# Patient Record
Sex: Female | Born: 1975 | Race: Black or African American | Hispanic: No | Smoking: Never smoker
Health system: Southern US, Community
[De-identification: ages and names within clinical notes are randomized; demographics above are authoritative.]

## PROBLEM LIST (undated history)

## (undated) DIAGNOSIS — Z789 Other specified health status: Secondary | ICD-10-CM

## (undated) HISTORY — PX: BREAST LUMPECTOMY: SHX2

---

## 1998-06-05 ENCOUNTER — Inpatient Hospital Stay (HOSPITAL_COMMUNITY): Admission: AD | Admit: 1998-06-05 | Discharge: 1998-06-08 | Payer: Self-pay | Admitting: Obstetrics

## 1998-08-08 ENCOUNTER — Inpatient Hospital Stay (HOSPITAL_COMMUNITY): Admission: AD | Admit: 1998-08-08 | Discharge: 1998-08-08 | Payer: Self-pay | Admitting: Obstetrics & Gynecology

## 1998-08-14 ENCOUNTER — Inpatient Hospital Stay (HOSPITAL_COMMUNITY): Admission: AD | Admit: 1998-08-14 | Discharge: 1998-08-14 | Payer: Self-pay | Admitting: Obstetrics

## 1998-09-09 ENCOUNTER — Inpatient Hospital Stay (HOSPITAL_COMMUNITY): Admission: AD | Admit: 1998-09-09 | Discharge: 1998-09-09 | Payer: Self-pay | Admitting: Obstetrics

## 1998-10-18 ENCOUNTER — Inpatient Hospital Stay (HOSPITAL_COMMUNITY): Admission: AD | Admit: 1998-10-18 | Discharge: 1998-10-24 | Payer: Self-pay | Admitting: Obstetrics

## 1998-10-28 ENCOUNTER — Inpatient Hospital Stay (HOSPITAL_COMMUNITY): Admission: AD | Admit: 1998-10-28 | Discharge: 1998-10-28 | Payer: Self-pay | Admitting: Obstetrics & Gynecology

## 1998-11-11 ENCOUNTER — Inpatient Hospital Stay (HOSPITAL_COMMUNITY): Admission: AD | Admit: 1998-11-11 | Discharge: 1998-11-11 | Payer: Self-pay | Admitting: *Deleted

## 1998-11-13 ENCOUNTER — Inpatient Hospital Stay (HOSPITAL_COMMUNITY): Admission: AD | Admit: 1998-11-13 | Discharge: 1998-11-15 | Payer: Self-pay | Admitting: *Deleted

## 2001-07-20 ENCOUNTER — Encounter: Payer: Self-pay | Admitting: Emergency Medicine

## 2001-07-20 ENCOUNTER — Emergency Department (HOSPITAL_COMMUNITY): Admission: EM | Admit: 2001-07-20 | Discharge: 2001-07-20 | Payer: Self-pay | Admitting: Emergency Medicine

## 2001-08-31 ENCOUNTER — Encounter: Payer: Self-pay | Admitting: Emergency Medicine

## 2001-08-31 ENCOUNTER — Emergency Department (HOSPITAL_COMMUNITY): Admission: EM | Admit: 2001-08-31 | Discharge: 2001-08-31 | Payer: Self-pay | Admitting: Emergency Medicine

## 2001-09-01 ENCOUNTER — Emergency Department (HOSPITAL_COMMUNITY): Admission: EM | Admit: 2001-09-01 | Discharge: 2001-09-01 | Payer: Self-pay | Admitting: Emergency Medicine

## 2001-09-01 ENCOUNTER — Encounter: Payer: Self-pay | Admitting: Emergency Medicine

## 2002-12-08 ENCOUNTER — Emergency Department (HOSPITAL_COMMUNITY): Admission: EM | Admit: 2002-12-08 | Discharge: 2002-12-08 | Payer: Self-pay | Admitting: Emergency Medicine

## 2003-05-01 ENCOUNTER — Emergency Department (HOSPITAL_COMMUNITY): Admission: EM | Admit: 2003-05-01 | Discharge: 2003-05-02 | Payer: Self-pay | Admitting: Emergency Medicine

## 2003-06-13 ENCOUNTER — Emergency Department (HOSPITAL_COMMUNITY): Admission: EM | Admit: 2003-06-13 | Discharge: 2003-06-13 | Payer: Self-pay

## 2004-07-21 ENCOUNTER — Inpatient Hospital Stay (HOSPITAL_COMMUNITY): Admission: AD | Admit: 2004-07-21 | Discharge: 2004-07-22 | Payer: Self-pay | Admitting: Family Medicine

## 2004-07-28 ENCOUNTER — Emergency Department (HOSPITAL_COMMUNITY): Admission: EM | Admit: 2004-07-28 | Discharge: 2004-07-28 | Payer: Self-pay | Admitting: Emergency Medicine

## 2005-01-22 ENCOUNTER — Emergency Department (HOSPITAL_COMMUNITY): Admission: EM | Admit: 2005-01-22 | Discharge: 2005-01-22 | Payer: Self-pay | Admitting: Emergency Medicine

## 2005-06-11 ENCOUNTER — Emergency Department (HOSPITAL_COMMUNITY): Admission: EM | Admit: 2005-06-11 | Discharge: 2005-06-11 | Payer: Self-pay | Admitting: Emergency Medicine

## 2005-08-25 ENCOUNTER — Ambulatory Visit: Payer: Self-pay | Admitting: Obstetrics and Gynecology

## 2005-08-25 ENCOUNTER — Encounter (INDEPENDENT_AMBULATORY_CARE_PROVIDER_SITE_OTHER): Payer: Self-pay | Admitting: *Deleted

## 2005-10-13 ENCOUNTER — Emergency Department (HOSPITAL_COMMUNITY): Admission: EM | Admit: 2005-10-13 | Discharge: 2005-10-13 | Payer: Self-pay | Admitting: Emergency Medicine

## 2006-03-01 ENCOUNTER — Emergency Department (HOSPITAL_COMMUNITY): Admission: EM | Admit: 2006-03-01 | Discharge: 2006-03-01 | Payer: Self-pay | Admitting: Emergency Medicine

## 2006-03-05 ENCOUNTER — Ambulatory Visit: Payer: Self-pay | Admitting: Gynecology

## 2006-03-05 ENCOUNTER — Encounter (INDEPENDENT_AMBULATORY_CARE_PROVIDER_SITE_OTHER): Payer: Self-pay | Admitting: *Deleted

## 2006-11-17 ENCOUNTER — Ambulatory Visit: Payer: Self-pay | Admitting: Obstetrics and Gynecology

## 2006-11-17 ENCOUNTER — Encounter (INDEPENDENT_AMBULATORY_CARE_PROVIDER_SITE_OTHER): Payer: Self-pay | Admitting: *Deleted

## 2007-01-06 ENCOUNTER — Ambulatory Visit: Payer: Self-pay | Admitting: Obstetrics & Gynecology

## 2007-01-06 ENCOUNTER — Other Ambulatory Visit: Admission: RE | Admit: 2007-01-06 | Discharge: 2007-01-06 | Payer: Self-pay | Admitting: Obstetrics & Gynecology

## 2007-01-15 ENCOUNTER — Ambulatory Visit: Payer: Self-pay | Admitting: *Deleted

## 2007-01-15 ENCOUNTER — Inpatient Hospital Stay (HOSPITAL_COMMUNITY): Admission: AD | Admit: 2007-01-15 | Discharge: 2007-01-15 | Payer: Self-pay | Admitting: Obstetrics and Gynecology

## 2007-01-20 ENCOUNTER — Ambulatory Visit: Payer: Self-pay | Admitting: Obstetrics & Gynecology

## 2007-05-26 ENCOUNTER — Encounter (INDEPENDENT_AMBULATORY_CARE_PROVIDER_SITE_OTHER): Payer: Self-pay | Admitting: Obstetrics & Gynecology

## 2007-05-26 ENCOUNTER — Ambulatory Visit: Payer: Self-pay | Admitting: Obstetrics & Gynecology

## 2007-06-01 ENCOUNTER — Emergency Department (HOSPITAL_COMMUNITY): Admission: EM | Admit: 2007-06-01 | Discharge: 2007-06-02 | Payer: Self-pay | Admitting: Emergency Medicine

## 2007-11-18 ENCOUNTER — Encounter (INDEPENDENT_AMBULATORY_CARE_PROVIDER_SITE_OTHER): Payer: Self-pay | Admitting: Gynecology

## 2007-11-18 ENCOUNTER — Ambulatory Visit: Payer: Self-pay | Admitting: Gynecology

## 2010-07-19 ENCOUNTER — Emergency Department (HOSPITAL_COMMUNITY): Admission: EM | Admit: 2010-07-19 | Discharge: 2010-07-19 | Payer: Self-pay | Admitting: Emergency Medicine

## 2011-04-21 NOTE — Group Therapy Note (Signed)
Patricia Ford, HEINE NO.:  [id number]   MEDICAL RECORD NO.:  [id number]          PATIENT TYPE:  WOC   LOCATION:  [facility]                   FACILITY:  [facility]   PHYSICIAN:  Dorthula Perfect, MD     DATE OF BIRTH:  1976/08/31   DATE OF SERVICE:                                  CLINIC NOTE   A 35 year old black female returns today for followup pap smear. She is  gravida 3 and para 3. She is not having menstrual periods. She [facility]  [facility] and is due to have  it repeated in August.   She had a LEEP procedure done here in January of this year. Pathology  revealed low grade squamous intraepithelial lesion involving the  endocervical margin in the 12 to 3 and 3 to 6 o'clock quadrant and the  ectocervical margin was not involved.   She has been complaining of a 3 day history of her left ear hurting. She  denies upper respiratory tract symptoms.   PHYSICAL EXAMINATION:  Height 5 feet 5 inches, weight 173, blood  pressure 112/75.  Examination with the otoscope reveals both ears to be completely normal.  Tympanic membranes are normal.  ABDOMEN: Flat, soft, and nontender.  PELVIC EXAM: External genitalia and bus glands are normal. Vaginal vault  was epithelized as was the cervix which showed scarring from her LEEP  procedure. On bimanual exam the uterus is of normal size and shape. It  is nontender. The adnexal strictures are normal.   IMPRESSION:  History of CIN 1 cervical disease with LEEP.   DISPOSITION:  1. Pap smear.  2. Return in 6 months for repeat pap smear.           ______________________________  Dorthula Perfect, MD     ER/MEDQ  D:  05/26/2007  T:  05/26/2007  Job:  [id number]

## 2011-04-24 NOTE — Group Therapy Note (Signed)
Patricia Ford, NO NO.:  [id number]   MEDICAL RECORD NO.:  [id number]          PATIENT TYPE:  WOC   LOCATION:  [facility]                   FACILITY:  [facility]   PHYSICIAN:  Argentina Donovan, MD        DATE OF BIRTH:  02/02/1976   DATE OF SERVICE:                                  CLINIC NOTE   The patient is a 35 year old gravida 3, para 3-0-0-3, who had been seen  in 2006 by the [facility] department for an abnormal Pap smear.  Colposcopy  was done at that time and showed CIN-1, and was considered  unsatisfactory because they could not see the entire lesion.  So, she  came here for consideration of a LEEP or other procedure.  On the  examination, the patient had a clean, parous cervix with a very a small  external os.  So, I did an endocervical Pap smear and also HPV typing.  The Pap smear at that time came back normal.  However, it was filed at  the back of the chart so that the one in front of it which was an older  smear seemed to be the most abnormal.  Therefore in followup when she  recently had a Pap smear which was mild dysplasia, because of the  previous notes in the health department, she was referred for possible  LEEP.  In reviewing the chart, I think we may have cleared that up.  As  far as we can tell, the most recent Pap smear showed LSIL with no sign  of high-risk HPV.  Therefore, instead of doing the LEEP however, not  wanting to this anything significant, we were going to repeat the Pap  smear, screen for HPV and in addition have the patient see the film on  LEEP in case the Pap smear does come back significantly abnormal.  We  will call her and have her come back in for a LEEP without having to go  through an extra visit.   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

## 2011-04-24 NOTE — Group Therapy Note (Signed)
NAMEJORETTA, EADS NO.:  [id number]   MEDICAL RECORD NO.:  [id number]          PATIENT TYPE:  WOC   LOCATION:  [facility]                   FACILITY:  [facility]   PHYSICIAN:  Argentina Donovan, MD        DATE OF BIRTH:  10/03/1976   DATE OF SERVICE:  08/25/2005                                    CLINIC NOTE   Patient is a 35 year old gravida 3, para 3-0-0-3 who was [facility] with an abnormal Pap smear.  Colposcopy was done and the biopsy  showed CIN I.  It was considered unsatisfactory because they could not see  the entire lesion so she was sent in here for consideration for LEEP or some  other procedure.  On examination the patient has a clean, parous cervix with  a rather small external os.  So what I have done is using a brush, did an  endocervical Pap smear and also an HPV typing.  If there is no significant  high-risk cells will probably repeat her Pap smear in six months.  If there  is significant pathology in the endocervix will do a LEEP biopsy.  She  seemed to be satisfied with this explanation.  She also has complained of  some external vulvar bumps forming but on examination they are just small  hair follicle abscesses that are scarred up and no sign of any condyloma  acuminata.   IMPRESSION:  Unsatisfactory colposcopy with CIN I.           ______________________________  Argentina Donovan, MD     PR/MEDQ  D:  08/25/2005  T:  08/26/2005  Job:  [id number]

## 2012-06-30 ENCOUNTER — Encounter: Payer: Self-pay | Admitting: Advanced Practice Midwife

## 2012-09-04 ENCOUNTER — Emergency Department (HOSPITAL_COMMUNITY): Payer: Medicaid Other

## 2012-09-04 ENCOUNTER — Encounter (HOSPITAL_COMMUNITY): Payer: Self-pay | Admitting: *Deleted

## 2012-09-04 ENCOUNTER — Emergency Department (HOSPITAL_COMMUNITY)
Admission: EM | Admit: 2012-09-04 | Discharge: 2012-09-04 | Disposition: A | Discharge status: Home / Self Care | Payer: Medicaid Other | Attending: Emergency Medicine | Admitting: Emergency Medicine

## 2012-09-04 DIAGNOSIS — R071 Chest pain on breathing: Secondary | ICD-10-CM | POA: Insufficient documentation

## 2012-09-04 DIAGNOSIS — R110 Nausea: Secondary | ICD-10-CM | POA: Insufficient documentation

## 2012-09-04 DIAGNOSIS — R0789 Other chest pain: Secondary | ICD-10-CM

## 2012-09-04 DIAGNOSIS — R1011 Right upper quadrant pain: Secondary | ICD-10-CM | POA: Insufficient documentation

## 2012-09-04 LAB — URINALYSIS, ROUTINE W REFLEX MICROSCOPIC
Ketones, ur: NEGATIVE mg/dL
Leukocytes, UA: NEGATIVE
Nitrite: NEGATIVE
Protein, ur: NEGATIVE mg/dL
Urobilinogen, UA: 0.2 mg/dL (ref 0.0–1.0)

## 2012-09-04 LAB — COMPREHENSIVE METABOLIC PANEL
ALT: 11 U/L (ref 0–35)
Albumin: 3.7 g/dL (ref 3.5–5.2)
Alkaline Phosphatase: 59 U/L (ref 39–117)
BUN: 10 mg/dL (ref 6–23)
CO2: 23 mEq/L (ref 19–32)
Calcium: 9.2 mg/dL (ref 8.4–10.5)
Chloride: 101 mEq/L (ref 96–112)
Creatinine, Ser: 0.6 mg/dL (ref 0.50–1.10)
GFR calc Af Amer: >90 mL/min (ref >90)
Glucose, Bld: 84 mg/dL (ref 70–99)
Potassium: 4 mEq/L (ref 3.5–5.1)
Total Protein: 7.7 g/dL (ref 6.0–8.3)

## 2012-09-04 MED ORDER — GI COCKTAIL ~~LOC~~
30.0000 mL | Freq: Once | ORAL | Status: AC
  Administered 2012-09-04: 30 mL via ORAL
  Filled 2012-09-04: qty 30

## 2012-09-04 MED ORDER — ONDANSETRON HCL 4 MG PO TABS: 4.0000 mg | ORAL_TABLET | Freq: Four times a day (QID) | ORAL | Status: DC

## 2012-09-04 MED ORDER — IBUPROFEN 400 MG PO TABS: 400.0000 mg | ORAL_TABLET | Freq: Four times a day (QID) | ORAL | Status: DC | PRN

## 2012-09-04 MED ORDER — ONDANSETRON HCL 4 MG/2ML IJ SOLN
4.0000 mg | Freq: Once | INTRAMUSCULAR | Status: AC
  Administered 2012-09-04: 4 mg via INTRAVENOUS
  Filled 2012-09-04: qty 2

## 2012-09-04 MED ORDER — HYDROMORPHONE HCL PF 1 MG/ML IJ SOLN
0.5000 mg | Freq: Once | INTRAMUSCULAR | Status: AC
  Administered 2012-09-04: 0.5 mg via INTRAVENOUS
  Filled 2012-09-04: qty 1

## 2012-09-04 MED ORDER — POLYETHYLENE GLYCOL 3350 17 GM/SCOOP PO POWD: 17.0000 g | Freq: Every day | ORAL | Status: DC

## 2012-09-04 MED ORDER — HYDROCODONE-ACETAMINOPHEN 5-325 MG PO TABS: 2.0000 | ORAL_TABLET | ORAL | Status: DC | PRN

## 2012-09-04 MED ORDER — DIPHENHYDRAMINE HCL 50 MG/ML IJ SOLN: 25.0000 mg | INTRAMUSCULAR | Status: DC | PRN

## 2012-09-04 NOTE — ED Notes (Signed)
Pt reports pain beneath right breast. Pt reports pain seems to move over to left. Pt also reports feeling like her heart is "fluttering."  Pt denies shortness of breath or nausea. Pt reports pain increases with movement. Pt also reports pain with movement. Pt reports having this pain prior and thought it was a muscle strain. Pt has used tylenol and pepto bismol at home without relief.

## 2012-09-04 NOTE — ED Provider Notes (Signed)
History     CSN: [id number]  Arrival date & time 09/04/12  1500   First MD Initiated Contact with Patient 09/04/12 1559      Chief Complaint  Patient presents with  . Chest Pain    (Consider location/radiation/quality/duration/timing/severity/associated sxs/prior treatment) HPI Patricia Ford is a 36 y.o. female or who presents to the ER with a 1-1/2 weeks worsening right upper quadrant pain is acutely worsened over the last 24 hours. It is described as a burning pain, a 10/10, it is worse after eating-she noticed it worsened acutely last night after eating spaghetti), she's had nausea without vomiting, no diarrhea, no subjective fevers or chills, no change in her stools, no central chest pain, pressure type chest pain or shortness of breath. She says she does notice a fluttering in her chest when the pain is acute, and the pain occasionally radiates to the right shoulder blade. She's taken Tylenol without much relief.  No history of GERD. Patient's sister recently had her gallbladder out. No other alleviating or exacerbating factors and no other associated symptoms.  History reviewed. No pertinent past medical history.  Past Surgical History  Procedure Date  . Breast lumpectomy     No family history on file.  History  Substance Use Topics  . Smoking status: Never Smoker   . Smokeless tobacco: Not on file  . Alcohol Use: No    OB History    Grav Para Term Preterm Abortions TAB SAB Ect Mult Living                  Review of Systems At least 10pt or greater review of systems completed and are negative except where specified in the HPI.  Allergies  Penicillins and Percocet  Home Medications   Current Outpatient Rx  Name Route Sig Dispense Refill  . ACETAMINOPHEN 500 MG PO TABS Oral Take 500 mg by mouth every 6 (six) hours as needed. pain      BP 114/74  Pulse 79  Temp 98.1 F (36.7 C) (Oral)  Resp 24  SpO2 98%  Physical Exam  Nursing notes reviewed.   Electronic medical record reviewed. VITAL SIGNS:   Filed Vitals:   09/04/12 1516  BP: 114/74  Pulse: 79  Temp: 98.1 F (36.7 C)  TempSrc: Oral  Resp: 24  SpO2: 98%   CONSTITUTIONAL: Awake, oriented, appears non-toxic HENT: Atraumatic, normocephalic, oral mucosa pink and moist, airway patent. Nares patent without drainage. External ears normal. EYES: Conjunctiva clear, EOMI, PERRLA NECK: Trachea midline, non-tender, supple CARDIOVASCULAR: Normal heart rate, Normal rhythm, No murmurs, rubs, gallops PULMONARY/CHEST: Clear to auscultation, no rhonchi, wheezes, or rales. Symmetrical breath sounds. Non-tender. ABDOMINAL: Non-distended, soft, mild TTP in RUQ.  BS normal. NEUROLOGIC: Non-focal, moving all four extremities, no gross sensory or motor deficits. EXTREMITIES: No clubbing, cyanosis, or edema SKIN: Warm, Dry, No erythema, No rash  ED Course  Procedures (including critical care time)  Date: 09/04/2012  Rate: 60  Rhythm: normal sinus rhythm  QRS Axis: normal  Intervals: normal  ST/T Wave abnormalities: normal  Conduction Disutrbances: none  Narrative Interpretation: unremarkable - sinus rhythm  Labs Reviewed  COMPREHENSIVE METABOLIC PANEL - Abnormal; Notable for the following:    Sodium 134 (*)     Total Bilirubin 0.2 (*)     All other components within normal limits  LIPASE, BLOOD  URINALYSIS, ROUTINE W REFLEX MICROSCOPIC  PREGNANCY, URINE   US Abdomen Complete  09/04/2012  *RADIOLOGY REPORT*  Clinical Data: Right upper  quadrant pain, worse after eating.  ABDOMEN ULTRASOUND  Technique:  Complete abdominal ultrasound examination was performed including evaluation of the liver, gallbladder, bile ducts, pancreas, kidneys, spleen, IVC, and abdominal aorta.  Comparison: No comparison studies available.  Findings:  Gallbladder:  There is no evidence for gallstones.  No gallbladder wall thickening or pericholecystic fluid.  The sonographer reports no sonographic Murphy's sign.   Common Bile Duct:  Nondilated at 4 mm diameter.  Liver:  Normal.  No focal parenchymal abnormality.  No biliary dilation.  IVC:  Normal.  Pancreas:  Normal.  Spleen:  Normal.  Right kidney:  10.9 cm in long axis.  Mild fullness of the renal pelvis.  Otherwise normal.  Left kidney:  10.6 cm in long axis.  Normal.  Abdominal Aorta:  No aneurysm.  IMPRESSION: Mild fullness of the right renal pelvis without overt hydronephrosis on either side.  Right-sided changes may be related to hydration status or developmental.  Otherwise normal exam.   Original Report Authenticated By: ERIC A. MANSELL, M.D.      No diagnosis found.    MDM  Patricia Ford is a 36 y.o. female right upper corner pain with some associated fluttering when the pain is bad but does radiate to the right shoulder blade this is concerning for gallbladder disease. I do not think that the pain she is experiencing is related to acute coronary syndrome, she is PERC negative - more than likely this is due to a problem with her GI tract also will consider dyspepsia, acid reflux, hepatitis, pancreatitis.  Patient is feeling better with medications, her laboratory workup is unremarkable. Ultrasound of the right upper quadrant is nonacute, no cholecystitis, cholelithiasis, ductal dilation. On evaluation the right kidney, there is mild fullness of the renal pelvis which radiologist remarks may be related to hydration status versus developmental. Urinalysis is benign, no blood cells seen in the urine, I have no suspicion for obstruction of the right ureter, and the patient is benign and in no apparent distress on physical exam.  Patient received no relief from the GI cocktail, in fact it made her slightly nauseous. Saltine crackers resolve this problem. Chest x-ray is unremarkable also.  Patient is in no acute distress, and is having no pain at this time, she'll be discharged to home in good condition with some pain medicine, nausea medicine,  ibuprofen for chest wall pain and some GlycoLax so that she does not become constipated.  I explained the diagnosis and have given explicit precautions to return to the ER including worsening abdominal pain, vomiting despite medications, high fevers or any other new or worsening symptoms. The patient understands and accepts the medical plan as it's been dictated and I have answered their questions. Discharge instructions concerning home care and prescriptions have been given.  The patient is STABLE and is discharged to home in good condition.       Jones Skene, MD 09/04/12 2100

## 2015-02-26 ENCOUNTER — Encounter (HOSPITAL_COMMUNITY): Payer: Self-pay

## 2015-02-26 ENCOUNTER — Emergency Department (HOSPITAL_COMMUNITY)
Admission: EM | Admit: 2015-02-26 | Discharge: 2015-02-26 | Disposition: A | Discharge status: Home / Self Care | Payer: Medicaid Other | Attending: Emergency Medicine | Admitting: Emergency Medicine

## 2015-02-26 DIAGNOSIS — Z79899 Other long term (current) drug therapy: Secondary | ICD-10-CM | POA: Insufficient documentation

## 2015-02-26 DIAGNOSIS — Z880 Allergy status to penicillin: Secondary | ICD-10-CM | POA: Diagnosis not present

## 2015-02-26 DIAGNOSIS — R51 Headache: Secondary | ICD-10-CM | POA: Diagnosis not present

## 2015-02-26 DIAGNOSIS — Z793 Long term (current) use of hormonal contraceptives: Secondary | ICD-10-CM | POA: Diagnosis not present

## 2015-02-26 DIAGNOSIS — R519 Headache, unspecified: Secondary | ICD-10-CM

## 2015-02-26 MED ORDER — ONDANSETRON 8 MG PO TBDP: 8.0000 mg | ORAL_TABLET | Freq: Three times a day (TID) | ORAL | Status: DC | PRN

## 2015-02-26 MED ORDER — MORPHINE SULFATE 4 MG/ML IJ SOLN
4.0000 mg | Freq: Once | INTRAMUSCULAR | Status: AC
  Administered 2015-02-26: 4 mg via INTRAVENOUS
  Filled 2015-02-26: qty 1

## 2015-02-26 MED ORDER — NAPROXEN 500 MG PO TABS: 500.0000 mg | ORAL_TABLET | Freq: Two times a day (BID) | ORAL | Status: DC

## 2015-02-26 MED ORDER — ONDANSETRON HCL 4 MG/2ML IJ SOLN
4.0000 mg | Freq: Once | INTRAMUSCULAR | Status: AC
  Administered 2015-02-26: 4 mg via INTRAVENOUS
  Filled 2015-02-26: qty 2

## 2015-02-26 MED ORDER — SODIUM CHLORIDE 0.9 % IV BOLUS (SEPSIS)
1000.0000 mL | Freq: Once | INTRAVENOUS | Status: AC
  Administered 2015-02-26: 1000 mL via INTRAVENOUS

## 2015-02-26 MED ORDER — KETOROLAC TROMETHAMINE 30 MG/ML IJ SOLN
30.0000 mg | Freq: Once | INTRAMUSCULAR | Status: AC
  Administered 2015-02-26: 30 mg via INTRAVENOUS
  Filled 2015-02-26: qty 1

## 2015-02-26 MED ORDER — PROCHLORPERAZINE EDISYLATE 5 MG/ML IJ SOLN
10.0000 mg | Freq: Four times a day (QID) | INTRAMUSCULAR | Status: DC | PRN
  Administered 2015-02-26: 10 mg via INTRAVENOUS
  Filled 2015-02-26: qty 2

## 2015-02-26 NOTE — Discharge Instructions (Signed)

## 2015-02-26 NOTE — ED Notes (Signed)
Pt comfortable with discharge and follow up instructions. Prescriptions x2. 

## 2015-02-26 NOTE — ED Notes (Signed)
Pt. Developed a headache on Sunday.  Describes the pain generalized throbbing all over.   Pt. Having nausea denies any vomiting.  Pt. Is seeing spots at times.  Photophobia present.  Pt. Denies having a migraine hx. GCS  15 .   No neuro deficits noted

## 2015-02-26 NOTE — ED Provider Notes (Signed)
CSN: [id number]     Arrival date & time 02/26/15  4098 History   First MD Initiated Contact with Patient 02/26/15 [phone]     Chief Complaint  Patient presents with  . Headache      HPI Patient presents with persistent headache over the past 3 days.  No prior history of headaches or migraines.  No recent injury or trauma.  She describes the pain as a throbbing sensation all over.  At time she has spots but she denies blurred vision.  She reports photophobia and phonophobia.  She denies weakness of her arms or legs.  No use of anticoagulants.  No fevers or chills.  No neck pain or stiffness.   History reviewed. No pertinent past medical history. Past Surgical History  Procedure Laterality Date  . Breast lumpectomy     No family history on file. History  Substance Use Topics  . Smoking status: Never Smoker   . Smokeless tobacco: Not on file  . Alcohol Use: No   OB History    No data available     Review of Systems  All other systems reviewed and are negative.     Allergies  Penicillins; Hydrocodone; and Percocet  Home Medications   Prior to Admission medications   Medication Sig Start Date End Date Taking? Authorizing Provider  BIOTIN PO Take 2 tablets by mouth daily.   Yes Historical Provider, MD  diphenhydrAMINE (BENADRYL) 25 MG tablet Take 25 mg by mouth every 6 (six) hours as needed for itching or allergies.   Yes Historical Provider, MD  etonogestrel (NEXPLANON) 68 MG IMPL implant 1 each by Subdermal route once.   Yes Historical Provider, MD  Multiple Vitamins-Minerals (MULTIVITAL) tablet Take 1 tablet by mouth daily.   Yes Historical Provider, MD  naproxen sodium (ANAPROX) 220 MG tablet Take 440 mg by mouth daily as needed (headache).   Yes Historical Provider, MD  naproxen (NAPROSYN) 500 MG tablet Take 1 tablet (500 mg total) by mouth 2 (two) times daily. 02/26/15   Azalia Bilis, MD  ondansetron (ZOFRAN ODT) 8 MG disintegrating tablet Take 1 tablet (8 mg total) by  mouth every 8 (eight) hours as needed for nausea or vomiting. 02/26/15   Azalia Bilis, MD   BP 128/69 mmHg  Pulse 59  Temp(Src) 98.4 F (36.9 C) (Oral)  Resp 21  Ht  (1.626 m)  Wt 155 lb (70.308 kg)  BMI 26.59 kg/m2  SpO2 99% Physical Exam  Constitutional: She is oriented to person, place, and time. She appears well-developed and well-nourished. No distress.  HENT:  Head: Normocephalic and atraumatic.  Eyes: EOM are normal. Pupils are equal, round, and reactive to light.  Neck: Normal range of motion.  Cardiovascular: Normal rate, regular rhythm and normal heart sounds.   Pulmonary/Chest: Effort normal and breath sounds normal.  Abdominal: Soft. She exhibits no distension. There is no tenderness.  Musculoskeletal: Normal range of motion.  Neurological: She is alert and oriented to person, place, and time.  5/5 strength in major muscle groups of  bilateral upper and lower extremities. Speech normal. No facial asymetry.   Skin: Skin is warm and dry.  Psychiatric: She has a normal mood and affect. Judgment normal.  Nursing note and vitals reviewed.   ED Course  Procedures (including critical care time) Labs Review Labs Reviewed - No data to display  Imaging Review No results found.   EKG Interpretation None      MDM   Final diagnoses:  Headache, unspecified headache type    11:05 AM Patient feels much better at this time.  Discharge home in good condition.  I suspect migraine variant.    Azalia BilisKevin Lania Zawistowski, MD 02/26/15 1106

## 2015-02-26 NOTE — ED Notes (Signed)
PT placed in gown and in bed. Pt monitored by pulse ox, bp cuff, and 5-lead. 

## 2015-02-28 ENCOUNTER — Emergency Department (HOSPITAL_COMMUNITY): Payer: Medicaid Other

## 2015-02-28 ENCOUNTER — Encounter (HOSPITAL_COMMUNITY): Payer: Self-pay | Admitting: Emergency Medicine

## 2015-02-28 ENCOUNTER — Emergency Department (HOSPITAL_COMMUNITY)
Admission: EM | Admit: 2015-02-28 | Discharge: 2015-02-28 | Disposition: A | Discharge status: Home / Self Care | Payer: Medicaid Other | Attending: Emergency Medicine | Admitting: Emergency Medicine

## 2015-02-28 DIAGNOSIS — Z791 Long term (current) use of non-steroidal anti-inflammatories (NSAID): Secondary | ICD-10-CM | POA: Insufficient documentation

## 2015-02-28 DIAGNOSIS — R0789 Other chest pain: Secondary | ICD-10-CM | POA: Insufficient documentation

## 2015-02-28 DIAGNOSIS — R002 Palpitations: Secondary | ICD-10-CM | POA: Diagnosis not present

## 2015-02-28 DIAGNOSIS — Z880 Allergy status to penicillin: Secondary | ICD-10-CM | POA: Diagnosis not present

## 2015-02-28 DIAGNOSIS — M79672 Pain in left foot: Secondary | ICD-10-CM | POA: Insufficient documentation

## 2015-02-28 DIAGNOSIS — R079 Chest pain, unspecified: Secondary | ICD-10-CM | POA: Diagnosis present

## 2015-02-28 DIAGNOSIS — H109 Unspecified conjunctivitis: Secondary | ICD-10-CM | POA: Insufficient documentation

## 2015-02-28 DIAGNOSIS — Z79899 Other long term (current) drug therapy: Secondary | ICD-10-CM | POA: Insufficient documentation

## 2015-02-28 LAB — I-STAT TROPONIN, ED: TROPONIN I, POC: 0 ng/mL (ref 0.00–0.08)

## 2015-02-28 LAB — CBC
HEMATOCRIT: 37.5 % (ref 36.0–46.0)
Hemoglobin: 12.6 g/dL (ref 12.0–15.0)
MCH: 29.9 pg (ref 26.0–34.0)
MCHC: 33.6 g/dL (ref 30.0–36.0)
MCV: 89.1 fL (ref 78.0–100.0)
Platelets: 289 10*3/uL (ref 150–400)
RBC: 4.21 MIL/uL (ref 3.87–5.11)
RDW: 12.8 % (ref 11.5–15.5)
WBC: 8.4 10*3/uL (ref 4.0–10.5)

## 2015-02-28 LAB — BASIC METABOLIC PANEL
Anion gap: 8 (ref 5–15)
BUN: 6 mg/dL (ref 6–23)
CHLORIDE: 105 mmol/L (ref 96–112)
CO2: 24 mmol/L (ref 19–32)
Calcium: 8.8 mg/dL (ref 8.4–10.5)
Creatinine, Ser: 0.57 mg/dL (ref 0.50–1.10)
GFR calc non Af Amer: >90 mL/min (ref >90)
Glucose, Bld: 133 mg/dL — ABNORMAL HIGH (ref 70–99)
POTASSIUM: 3.3 mmol/L — AB (ref 3.5–5.1)
Sodium: 137 mmol/L (ref 135–145)

## 2015-02-28 MED ORDER — POLYMYXIN B-TRIMETHOPRIM 10000-0.1 UNIT/ML-% OP SOLN
1.0000 [drp] | OPHTHALMIC | Status: DC
  Administered 2015-02-28: 1 [drp] via OPHTHALMIC
  Filled 2015-02-28: qty 10

## 2015-02-28 MED ORDER — POLYMYXIN B-TRIMETHOPRIM 10000-0.1 UNIT/ML-% OP SOLN: 1.0000 [drp] | OPHTHALMIC | Status: DC

## 2015-02-28 MED ORDER — TRAMADOL HCL 50 MG PO TABS
50.0000 mg | ORAL_TABLET | Freq: Once | ORAL | Status: AC
  Administered 2015-02-28: 50 mg via ORAL
  Filled 2015-02-28: qty 1

## 2015-02-28 MED ORDER — TRAMADOL-ACETAMINOPHEN 37.5-325 MG PO TABS: 1.0000 | ORAL_TABLET | Freq: Four times a day (QID) | ORAL | Status: DC | PRN

## 2015-02-28 MED ORDER — ACETAMINOPHEN 325 MG PO TABS
650.0000 mg | ORAL_TABLET | Freq: Once | ORAL | Status: AC
  Administered 2015-02-28: 650 mg via ORAL
  Filled 2015-02-28: qty 2

## 2015-02-28 NOTE — Discharge Instructions (Signed)
Take the prescribed medication as directed.  Continue using eye drops given in the ED. Follow[facility]up [facility]. Return to the ED for new or worsening symptoms.

## 2015-02-28 NOTE — ED Provider Notes (Signed)
CSN: [id number]     Arrival date & time 02/28/15  0827 History   First MD Initiated Contact with Patient 02/28/15 [phone]     Chief Complaint  Patient presents with  . Chest Pain  . Conjunctivitis     (Consider location/radiation/quality/duration/timing/severity/associated sxs/prior Treatment) Patient is a 39 y.o. female presenting with chest pain and conjunctivitis. The history is provided by the patient and medical records.  Chest Pain Associated symptoms: palpitations   Conjunctivitis Associated symptoms include arthralgias and chest pain.    This is a 39 year old female with no significant past medical history presenting to the ED for multiple complaints.  1-- left foot pain. States this began last night, she describes this as a burning sensation across the sole of her foot into her great toe. No noted injury, trauma, or falls. Patient does admit to working for the past days without break. She stands on a hard surface and wears work boots. Denies numbness, weakness, or paresthesias. She is not diabetic.  No swelling. No history of gout.  2--left eye pain. Began yesterday, associated with noted redness of lateral left eye. She states she has had purulent drainage this morning upon waking.  Denies known sick contacts. She denies any visual disturbance. No chemical or foreign body exposure to eye.  3-- chest pain and palpitations. This began upon arriving in the ED, patient states she feels like she may have panicked. Pain localized to mid/substernal regions without radiation.  She denies associated shortness of breath, diaphoresis, nausea, or vomiting. She has no known cardiac history. Family history of heart problems on her mother's side. Patient has never been a smoker.  Patient does have nexplanon in place.  No recent travel, LE edema, calf pain.  No hx of DVT or PE.  History reviewed. No pertinent past medical history. Past Surgical History  Procedure Laterality Date  . Breast  lumpectomy     No family history on file. History  Substance Use Topics  . Smoking status: Never Smoker   . Smokeless tobacco: Not on file  . Alcohol Use: No   OB History    No data available     Review of Systems  Eyes: Positive for redness.  Cardiovascular: Positive for chest pain and palpitations.  Musculoskeletal: Positive for arthralgias.  All other systems reviewed and are negative.     Allergies  Penicillins; Hydrocodone; and Percocet  Home Medications   Prior to Admission medications   Medication Sig Start Date End Date Taking? Authorizing Provider  BIOTIN PO Take 2 tablets by mouth daily.    Historical Provider, MD  diphenhydrAMINE (BENADRYL) 25 MG tablet Take 25 mg by mouth every 6 (six) hours as needed for itching or allergies.    Historical Provider, MD  etonogestrel (NEXPLANON) 68 MG IMPL implant 1 each by Subdermal route once.    Historical Provider, MD  Multiple Vitamins-Minerals (MULTIVITAL) tablet Take 1 tablet by mouth daily.    Historical Provider, MD  naproxen (NAPROSYN) 500 MG tablet Take 1 tablet (500 mg total) by mouth 2 (two) times daily. 02/26/15   Azalia BilisKevin Campos, MD  naproxen sodium (ANAPROX) 220 MG tablet Take 440 mg by mouth daily as needed (headache).    Historical Provider, MD  ondansetron (ZOFRAN ODT) 8 MG disintegrating tablet Take 1 tablet (8 mg total) by mouth every 8 (eight) hours as needed for nausea or vomiting. 02/26/15   Azalia BilisKevin Campos, MD   BP 133/93 mmHg  Pulse 82  Temp(Src) 98.1 F (36.7 C)  Resp 14  SpO2 100%   Physical Exam  Constitutional: She is oriented to person, place, and time. She appears well-developed and well-nourished. No distress.  HENT:  Head: Normocephalic and atraumatic.  Mouth/Throat: Oropharynx is clear and moist.  Eyes: EOM and lids are normal. Pupils are equal, round, and reactive to light. Left conjunctiva is injected.  Left conjunctiva injected along lateral aspect, crusting noted along lower lid line  Neck:  Normal range of motion. Neck supple.  Cardiovascular: Normal rate, regular rhythm and normal heart sounds.   Pulmonary/Chest: Effort normal and breath sounds normal. No respiratory distress. She has no wheezes.  Abdominal: Soft. Bowel sounds are normal. There is no tenderness. There is no guarding.  Musculoskeletal: Normal range of motion.       Left foot: Normal.  Left foot normal in appearance, endorses burning sensation along sole of foot and left great toe; no swelling or bony deformities noted; no skin changes or signs of infection; full ROM of ankle, foot, and all toes; foot is NVI No calf asymmetry, tenderness, or palpable cords; negative Homan's sign bilaterally; no overlying erythema or warmth to touch  Neurological: She is alert and oriented to person, place, and time.  Skin: Skin is warm and dry. She is not diaphoretic.  Psychiatric: She has a normal mood and affect.  Nursing note and vitals reviewed.   ED Course  Procedures (including critical care time) Labs Review Labs Reviewed  BASIC METABOLIC PANEL - Abnormal; Notable for the following:    Potassium 3.3 (*)    Glucose, Bld 133 (*)    All other components within normal limits  CBC  I-STAT TROPOININ, ED    Imaging Review Dg Chest 2 View  02/28/2015   CLINICAL DATA:  39 year old female with left chest pain, headache, left eye swelling, left foot pain. Symptoms for 2 days. Initial encounter.  EXAM: CHEST  2 VIEW  COMPARISON:  09/04/2012 and earlier.  FINDINGS: Normal lung volumes. Normal cardiac size and mediastinal contours. Visualized tracheal air column is within normal limits. No pneumothorax, pulmonary edema, pleural effusion or confluent pulmonary opacity. No acute osseous abnormality identified.  IMPRESSION: No acute cardiopulmonary abnormality.   Electronically Signed   By: Odessa Fleming M.D.   On: 02/28/2015 09:13     EKG Interpretation   Date/Time:  Thursday February 28 2015 08:42:37 EDT Ventricular Rate:  81 PR  Interval:  141 QRS Duration: 83 QT Interval:  364 QTC Calculation: 422 R Axis:   60 Text Interpretation:  Sinus rhythm Borderline T abnormalities, anterior  leads Baseline wander in lead(s) V6 No significant change since last  tracing Confirmed by HARRISON  MD, FORREST ([id number]) on 02/28/2015 8:46:22 AM      MDM   Final diagnoses:  Chest pain, unspecified chest pain type  Palpitations  Left foot pain  Conjunctivitis of left eye   39 y.o. F here with multiple complaints-- chest pain, left foot pain, and left eye pain.  EKG sinus rhythm without acute ischemic changes. Lab work is reassuring, troponin negative. Chest x-ray is clear. Patient is on nexplanon but she has no associated shortness of breath. She has no tachycardia, hypoxia, recent travel, clinical signs of DVT, or other risk factors for PE. Low suspicion for ACS, PE, dissection, or other acute cardiac event at this time.  Foot pain likely due to prolonged standing over the past week, no deformities or signs of infection noted on exam.  Doubt gout.  Left eye pain appears to  be related to conjunctivitis.  She has no visual disturbance.  No lid edema or erythema to suggest pre-orbital or septal cellulitis.  Patient given tramadol and polytrim drops in the ED with resolution of her pain.  Her VS remain stable.  Patient will be d/c home with eye drops and pain meds.  Encouraged to FU at wellness clinic.  Discussed plan with patient, he/she acknowledged understanding and agreed with plan of care.  Return precautions given for new or worsening symptoms.  Garlon Hatchet, PA-C 02/28/15 1244  Purvis Sheffield, MD 03/01/15 [phone]

## 2015-02-28 NOTE — ED Notes (Signed)
Patient states chest pain and fluttering.   Patient states L eye pain and swelling.   Patient states L foot pain.  Patient states she has had problems since Tuesday when we saw her for migraine.   Patient denies radiation of chest pain or other symptoms.

## 2015-11-09 ENCOUNTER — Inpatient Hospital Stay (HOSPITAL_COMMUNITY)
Admission: AD | Admit: 2015-11-09 | Discharge: 2015-11-09 | Disposition: A | Discharge status: Home / Self Care | Payer: Medicaid Other | Source: Ambulatory Visit | Attending: Obstetrics & Gynecology | Admitting: Obstetrics & Gynecology

## 2015-11-09 ENCOUNTER — Encounter (HOSPITAL_COMMUNITY): Payer: Self-pay

## 2015-11-09 DIAGNOSIS — N939 Abnormal uterine and vaginal bleeding, unspecified: Secondary | ICD-10-CM | POA: Insufficient documentation

## 2015-11-09 DIAGNOSIS — N921 Excessive and frequent menstruation with irregular cycle: Secondary | ICD-10-CM

## 2015-11-09 DIAGNOSIS — Z880 Allergy status to penicillin: Secondary | ICD-10-CM | POA: Insufficient documentation

## 2015-11-09 DIAGNOSIS — Z975 Presence of (intrauterine) contraceptive device: Secondary | ICD-10-CM | POA: Diagnosis not present

## 2015-11-09 DIAGNOSIS — Z3202 Encounter for pregnancy test, result negative: Secondary | ICD-10-CM | POA: Insufficient documentation

## 2015-11-09 HISTORY — DX: Other specified health status: Z78.9

## 2015-11-09 LAB — CBC
HCT: 37.7 % (ref 36.0–46.0)
Hemoglobin: 12.8 g/dL (ref 12.0–15.0)
MCH: 31 pg (ref 26.0–34.0)
MCHC: 34 g/dL (ref 30.0–36.0)
MCV: 91.3 fL (ref 78.0–100.0)
Platelets: 268 10*3/uL (ref 150–400)
RBC: 4.13 MIL/uL (ref 3.87–5.11)
RDW: 12.9 % (ref 11.5–15.5)
WBC: 8 10*3/uL (ref 4.0–10.5)

## 2015-11-09 LAB — WET PREP, GENITAL
Clue Cells Wet Prep HPF POC: NONE SEEN
SPERM: NONE SEEN
Trich, Wet Prep: NONE SEEN
Yeast Wet Prep HPF POC: NONE SEEN

## 2015-11-09 LAB — POCT PREGNANCY, URINE: Preg Test, Ur: NEGATIVE

## 2015-11-09 MED ORDER — NORGESTIMATE-ETH ESTRADIOL 0.25-35 MG-MCG PO TABS: 1.0000 | ORAL_TABLET | Freq: Every day | ORAL | Status: DC

## 2015-11-09 NOTE — Discharge Instructions (Signed)
Contraceptive Implant Information  A contraceptive implant is a plastic rod that is inserted under your skin. It is usually inserted under the skin of your upper arm. It continually releases small amounts of progestin (synthetic progesterone) into your bloodstream. This prevents an egg from being released from your ovaries. It also thickens your cervical mucus to prevent sperm from entering the cervix, and it thins your uterine lining to prevent a fertilized egg from attaching to your uterus. Contraceptive implants can be effective for up to 3 years. They do not provide protection against sexually transmitted diseases (STDs).   The procedure to insert an implant usually takes about 10 minutes. There may be minor bruising, swelling, and discomfort at the insertion site for a couple days. The implant begins to work within the first day. Other contraceptive protection may be necessary for 7 days. Be sure to discuss with your health care provider if you need a backup method of contraception.   Your health care provider will make sure you are a good candidate for the contraceptive implant. Discuss with your health care provider the possible side effects of the implant.  ADVANTAGES  · It prevents pregnancy for up to 3 years.  · It is easily reversible.  · It is convenient.  · It can be used when breastfeeding.  · It can be used by women who cannot take estrogen.  DISADVANTAGES  · You may have irregular or unplanned vaginal bleeding.  · You may develop side effects, including headache, weight gain, acne, breast tenderness, or mood changes.  · You may have tissue or nerve damage after insertion (rare).  · It may be difficult and uncomfortable to remove.  · Certain medicines may interfere with the effectiveness of the implant.   REMOVAL OF IMPLANT  The implant should be removed in 3 years or as directed by your health care provider. The implant's effect wears off in a few hours after removal. Your ability to get pregnant  (fertility) may be restored in 1-2 weeks. A new implant can be inserted as soon as the old one is removed if desired.  CONTRAINDICATIONS  You should not get the implant if you are experiencing any of the following situations:  · You are pregnant.  · You have a history of breast cancer, osteoporosis, blood clots, heart disease, diabetes, high blood pressure, liver disease, tumors, or stroke.    · You have undiagnosed vaginal bleeding.  · You have a sensitivity to any part of the implant.     This information is not intended to replace advice given to you by your health care provider. Make sure you discuss any questions you have with your health care provider.     Document Released: 11/12/2011 Document Revised: 07/26/2013 Document Reviewed: 05/22/2013  Elsevier Interactive Patient Education ©2016 [employer]vier Inc[employer]

## 2015-11-09 NOTE — MAU Note (Signed)
Went to HD, was treat BV  The weak of Thanksgiving.  Took all med.has been bleeding off and on since last wk, constant since yesterday. Has Nexplanon, usually does not have bleeding or periods.

## 2015-11-09 NOTE — MAU Provider Note (Signed)
Chief Complaint: Vaginal Bleeding   First Provider Initiated Contact with Patient 11/09/15 1916      SUBJECTIVE HPI: Patricia Ford is a 39 y.o. G77P3003 female who presents to Maternity Admissions reporting intermittent light vaginal bleeding since 11/01/15 that worsening last night. Was heavy enough to soak through clothing at work today. Has had Nexplanon since 2014. Hasn't had periods the whole time she has had it. [facility], Dx [facility]. Tx w/ Flagyl and completed the course. Unsure if they collected GC/Chlamydia.   Associated signs and symptoms: No associated and pain, dyspareunia, vaginal discharge.   Past Medical History  Diagnosis Date  . Medical history non-contributory    OB History  Gravida Para Term Preterm AB SAB TAB Ectopic Multiple Living  # Outcome Date GA Lbr Len/2nd Weight Sex Delivery Anes PTL Lv  3 Term           2 Term           1 Term              Past Surgical History  Procedure Laterality Date  . Breast lumpectomy     Social History   Social History  . Marital Status: Single    Spouse Name: N/A  . Number of Children: N/A  . Years of Education: N/A   Occupational History  . Not on file.   Social History Main Topics  . Smoking status: Never Smoker   . Smokeless tobacco: Not on file  . Alcohol Use: No  . Drug Use: No  . Sexual Activity: No   Other Topics Concern  . Not on file   Social History Narrative   No current facility-administered medications on file prior to encounter.   No current outpatient prescriptions on file prior to encounter.   Allergies  Allergen Reactions  . Penicillins Palpitations and Other (See Comments)    Has patient had a PCN reaction causing immediate rash, facial/tongue/throat swelling, SOB or lightheadedness with hypotension: No Has patient had a PCN reaction causing severe rash involving mucus membranes or skin necrosis: No Has patient had a PCN reaction that required  hospitalization No Has patient had a PCN reaction occurring within the last 10 years: No If all of the above answers are "NO", then may proceed with Cephalosporin use.  Marland Kitchen Hydrocodone Hives  . Percocet [Oxycodone-Acetaminophen] Hives  . Naproxen Swelling and Rash  . Vicodin [Hydrocodone-Acetaminophen] Swelling and Rash    I have reviewed the past Medical Hx, Surgical Hx, Social Hx, Allergies and Medications.   Review of Systems  Constitutional: Negative for fever and chills.  Gastrointestinal: Negative for nausea, vomiting, abdominal pain (mild cramping), diarrhea and constipation.  Genitourinary: Positive for vaginal bleeding. Negative for dysuria, urgency, frequency, hematuria, flank pain, vaginal discharge and vaginal pain.  Musculoskeletal: Negative for back pain.  Skin: Negative for pallor.  Neurological: Negative for dizziness and weakness.    OBJECTIVE Patient Vitals for the past 24 hrs:  BP Temp Temp src Pulse Resp  11/09/15 1921 140/85 mmHg - - 89 18  11/09/15 1641 123/76 mmHg 98.8 F (37.1 C) Oral (!) 58 16   Constitutional: Well-developed, well-nourished female in no acute distress. No pallor. Cardiovascular: normal rate Respiratory: normal rate and effort.  GI: Abd soft, non-tender. MS: Extremities nontender, no edema, normal ROM Neurologic: Alert and oriented x 4.  GU: Neg CVAT.  SPECULUM EXAM: NEFG,  small amount of dark red blood noted, cervix clean  BIMANUAL: cervix closed; uterus normal size, no adnexal tenderness or masses. No CMT.  LAB RESULTS Results for orders placed or performed during the hospital encounter of 11/09/15 (from the past 24 hour(s))  CBC     Status: None   Collection Time: 11/09/15  4:49 PM  Result Value Ref Range   WBC 8.0 4.0 - 10.5 K/uL   RBC 4.13 3.87 - 5.11 MIL/uL   Hemoglobin 12.8 12.0 - 15.0 g/dL   HCT 16.137.7 09.636.0 - 04.546.0 %   MCV 91.3 78.0 - 100.0 fL   MCH 31.0 26.0 - 34.0 pg   MCHC 34.0 30.0 - 36.0 g/dL   RDW 40.912.9 81.111.5 - 91.415.5 %    Platelets 268 150 - 400 K/uL  Pregnancy, urine POC     Status: None   Collection Time: 11/09/15  5:20 PM  Result Value Ref Range   Preg Test, Ur NEGATIVE NEGATIVE  Wet prep, genital     Status: Abnormal   Collection Time: 11/09/15  6:30 PM  Result Value Ref Range   Yeast Wet Prep HPF POC NONE SEEN NONE SEEN   Trich, Wet Prep NONE SEEN NONE SEEN   Clue Cells Wet Prep HPF POC NONE SEEN NONE SEEN   WBC, Wet Prep HPF POC FEW (A) NONE SEEN   Sperm NONE SEEN     IMAGING No results found.  MAU COURSE CBC, UPT, wet prep, GC/chlamydia cultures.  MDM 39 year old female with breakthrough bleeding on Nexplanon. Bleeding is stable. No evidence of pregnancy.  ASSESSMENT 1. Breakthrough bleeding on Implanon     PLAN Discharge home in stable condition. Bleeding Precautions Rx one pack of Sprintec 2 views if bleeding increases. Explained that breakthrough bleeding with nexplanon is common. GC/Chlamydia cultures pending.     Follow-up Information    [facility]   Why:  As needed if symptoms worsen   Contact information:   [address] [place] [id number] [phone]       [facility]   Why:  As needed in gynecologic emergencies   Contact information:   [address] [id number] [place] [id number] [phone]       Medication List    TAKE these medications        diphenhydrAMINE 25 mg capsule  Commonly known as:  BENADRYL  Take 25 mg by mouth at bedtime as needed for allergies.     multivitamin with minerals Tabs tablet  Take 1 tablet by mouth daily.     NEXPLANON 68 MG Impl implant  Generic drug:  etonogestrel  1 each by Subdermal route once.     norgestimate-ethinyl estradiol 0.25-35 MG-MCG tablet  Commonly known as:  ORTHO-CYCLEN,SPRINTEC,PREVIFEM  Take 1 tablet by mouth daily.       [place] Tennyson Kallen, CNM 11/09/2015  9:26 PM

## 2015-11-11 LAB — GC/CHLAMYDIA PROBE AMP (~~LOC~~) NOT AT ARMC
CHLAMYDIA, DNA PROBE: NEGATIVE
Neisseria Gonorrhea: NEGATIVE

## 2016-05-01 ENCOUNTER — Inpatient Hospital Stay (HOSPITAL_COMMUNITY)
Admission: AD | Admit: 2016-05-01 | Discharge: 2016-05-02 | Disposition: A | Discharge status: Home / Self Care | Payer: Medicaid Other | Source: Ambulatory Visit | Attending: Family Medicine | Admitting: Family Medicine

## 2016-05-01 DIAGNOSIS — Z880 Allergy status to penicillin: Secondary | ICD-10-CM | POA: Insufficient documentation

## 2016-05-01 DIAGNOSIS — N939 Abnormal uterine and vaginal bleeding, unspecified: Secondary | ICD-10-CM | POA: Insufficient documentation

## 2016-05-01 DIAGNOSIS — Z885 Allergy status to narcotic agent status: Secondary | ICD-10-CM | POA: Insufficient documentation

## 2016-05-01 DIAGNOSIS — Z3202 Encounter for pregnancy test, result negative: Secondary | ICD-10-CM | POA: Insufficient documentation

## 2016-05-01 NOTE — MAU Note (Addendum)
Have blood in my urine and pain in lower abd. Having vag bleeding when i have sex. Blood in urine for a week. Nexplanon removed Feb 2017 and Depo given. NExt Depo due 05/15/16

## 2016-05-02 ENCOUNTER — Encounter (HOSPITAL_COMMUNITY): Payer: Self-pay | Admitting: *Deleted

## 2016-05-02 DIAGNOSIS — N939 Abnormal uterine and vaginal bleeding, unspecified: Secondary | ICD-10-CM

## 2016-05-02 LAB — WET PREP, GENITAL
Clue Cells Wet Prep HPF POC: NONE SEEN
Sperm: NONE SEEN
Trich, Wet Prep: NONE SEEN
Yeast Wet Prep HPF POC: NONE SEEN

## 2016-05-02 LAB — URINE MICROSCOPIC-ADD ON

## 2016-05-02 LAB — URINALYSIS, ROUTINE W REFLEX MICROSCOPIC
BILIRUBIN URINE: NEGATIVE
GLUCOSE, UA: NEGATIVE mg/dL
KETONES UR: 15 mg/dL — AB
Leukocytes, UA: NEGATIVE
Nitrite: NEGATIVE
Protein, ur: NEGATIVE mg/dL
SPECIFIC GRAVITY, URINE: 1.02 (ref 1.005–1.030)
pH: 6 (ref 5.0–8.0)

## 2016-05-02 LAB — POCT PREGNANCY, URINE: Preg Test, Ur: NEGATIVE

## 2016-05-02 LAB — CBC
HCT: 38.3 % (ref 36.0–46.0)
Hemoglobin: 13.1 g/dL (ref 12.0–15.0)
MCH: 31 pg (ref 26.0–34.0)
MCHC: 34.2 g/dL (ref 30.0–36.0)
MCV: 90.5 fL (ref 78.0–100.0)
PLATELETS: 261 10*3/uL (ref 150–400)
RBC: 4.23 MIL/uL (ref 3.87–5.11)
RDW: 12.9 % (ref 11.5–15.5)
WBC: 9 10*3/uL (ref 4.0–10.5)

## 2016-05-02 LAB — HIV ANTIBODY (ROUTINE TESTING W REFLEX): HIV Screen 4th Generation wRfx: NONREACTIVE

## 2016-05-02 MED ORDER — MEGESTROL ACETATE 40 MG PO TABS: ORAL_TABLET | ORAL | Status: DC

## 2016-05-02 NOTE — MAU Provider Note (Signed)
Chief Complaint: Hematuria   First Provider Initiated Contact with Patient 05/02/16 0200      SUBJECTIVE HPI: Patricia Ford is a 40 y.o. G3P3003 who presents to maternity admissions reporting vaginal bleeding after intercourse and bleeding when she wipes after urinating x 1 week. The bleeding is associated with abdominal/pelvic cramping intermittent pain. She started Megace 2 days ago 40 mg daily [facility] but the bleeding seems worse instead of better.   She denies vaginal itching/burning, urinary symptoms, h/a, dizziness, n/v, or fever/chills.     HPI  Past Medical History  Diagnosis Date  . Medical history non-contributory    Past Surgical History  Procedure Laterality Date  . Breast lumpectomy     Social History   Social History  . Marital Status: Single    Spouse Name: N/A  . Number of Children: N/A  . Years of Education: N/A   Occupational History  . Not on file.   Social History Main Topics  . Smoking status: Never Smoker   . Smokeless tobacco: Not on file  . Alcohol Use: No  . Drug Use: No  . Sexual Activity: No   Other Topics Concern  . Not on file   Social History Narrative   No current facility-administered medications on file prior to encounter.   Current Outpatient Prescriptions on File Prior to Encounter  Medication Sig Dispense Refill  . diphenhydrAMINE (BENADRYL) 25 mg capsule Take 25 mg by mouth at bedtime as needed for allergies.    Marland Kitchen. etonogestrel (NEXPLANON) 68 MG IMPL implant 1 each by Subdermal route once.    . Multiple Vitamin (MULTIVITAMIN WITH MINERALS) TABS tablet Take 1 tablet by mouth daily.     Allergies  Allergen Reactions  . Penicillins Palpitations and Other (See Comments)    Has patient had a PCN reaction causing immediate rash, facial/tongue/throat swelling, SOB or lightheadedness with hypotension: No Has patient had a PCN reaction causing severe rash involving mucus membranes or skin necrosis:  No Has patient had a PCN reaction that required hospitalization No Has patient had a PCN reaction occurring within the last 10 years: No If all of the above answers are "NO", then may proceed with Cephalosporin use.  Marland Kitchen. Hydrocodone Hives  . Percocet [Oxycodone-Acetaminophen] Hives  . Naproxen Swelling and Rash  . Vicodin [Hydrocodone-Acetaminophen] Swelling and Rash    ROS:  Review of Systems  Constitutional: Negative for fever, chills and fatigue.  Respiratory: Negative for shortness of breath.   Cardiovascular: Negative for chest pain.  Genitourinary: Positive for hematuria, vaginal bleeding and pelvic pain. Negative for dysuria, flank pain, vaginal discharge, difficulty urinating and vaginal pain.  Neurological: Negative for dizziness and headaches.  Psychiatric/Behavioral: Negative.      I have reviewed patient's Past Medical Hx, Surgical Hx, Family Hx, Social Hx, medications and allergies.   Physical Exam   Patient Vitals for the past 24 hrs:  BP Temp Pulse Resp Height Weight  05/02/16 0322 131/80 mmHg 98 F (36.7 C) 61 18 - -  05/01/16 2332 124/80 mmHg 98.4 F (36.9 C) 72 18 5\' 5"  (1.651 m) 171 lb (77.565 kg)   Constitutional: Well-developed, well-nourished female in no acute distress.  Cardiovascular: normal rate Respiratory: normal effort GI: Abd soft, non-tender. Pos BS x 4 MS: Extremities nontender, no edema, normal ROM Neurologic: Alert and oriented x 4.  GU: Neg CVAT.  PELVIC EXAM: Cervix pink, visually closed, without lesion, moderate amount dark red blood in vaginal  vault, vaginal walls and external genitalia normal Bimanual exam: Cervix 0/long/high, firm, anterior, neg CMT, uterus nontender, nonenlarged, adnexa without tenderness, enlargement, or mass    LAB RESULTS Results for orders placed or performed during the hospital encounter of 05/01/16 (from the past 24 hour(s))  Urinalysis, Routine w reflex microscopic (not at [facility])     Status: Abnormal    Collection Time: 05/02/16 12:42 AM  Result Value Ref Range   Color, Urine YELLOW YELLOW   APPearance CLEAR CLEAR   Specific Gravity, Urine 1.020 1.005 - 1.030   pH 6.0 5.0 - 8.0   Glucose, UA NEGATIVE NEGATIVE mg/dL   Hgb urine dipstick LARGE (A) NEGATIVE   Bilirubin Urine NEGATIVE NEGATIVE   Ketones, ur 15 (A) NEGATIVE mg/dL   Protein, ur NEGATIVE NEGATIVE mg/dL   Nitrite NEGATIVE NEGATIVE   Leukocytes, UA NEGATIVE NEGATIVE  Urine microscopic-add on     Status: Abnormal   Collection Time: 05/02/16 12:42 AM  Result Value Ref Range   Squamous Epithelial / LPF 0-5 (A) NONE SEEN   WBC, UA 0-5 0 - 5 WBC/hpf   RBC / HPF 0-5 0 - 5 RBC/hpf   Bacteria, UA FEW (A) NONE SEEN   Crystals CA OXALATE CRYSTALS (A) NEGATIVE   Urine-Other MUCOUS PRESENT   Pregnancy, urine POC     Status: None   Collection Time: 05/02/16 12:47 AM  Result Value Ref Range   Preg Test, Ur NEGATIVE NEGATIVE  CBC     Status: None   Collection Time: 05/02/16  1:55 AM  Result Value Ref Range   WBC 9.0 4.0 - 10.5 K/uL   RBC 4.23 3.87 - 5.11 MIL/uL   Hemoglobin 13.1 12.0 - 15.0 g/dL   HCT 16.1 09.6 - 04.5 %   MCV 90.5 78.0 - 100.0 fL   MCH 31.0 26.0 - 34.0 pg   MCHC 34.2 30.0 - 36.0 g/dL   RDW 40.9 81.1 - 91.4 %   Platelets 261 150 - 400 K/uL  Wet prep, genital     Status: Abnormal   Collection Time: 05/02/16  2:05 AM  Result Value Ref Range   Yeast Wet Prep HPF POC NONE SEEN NONE SEEN   Trich, Wet Prep NONE SEEN NONE SEEN   Clue Cells Wet Prep HPF POC NONE SEEN NONE SEEN   WBC, Wet Prep HPF POC FEW (A) NONE SEEN   Sperm NONE SEEN        IMAGING No results found.  MAU Management/MDM: Ordered labs and reviewed results. Hgb stable, small amount of bleeding on exam.  Plan to treat with Megace, pt to f/u with her Gyn provider at [facility].   Pt stable at time of discharge.  ASSESSMENT 1. Abnormal uterine bleeding (AUB)     PLAN Discharge home with bleeding precautions    Medication List    STOP taking  these medications        norgestimate-ethinyl estradiol 0.25-35 MG-MCG tablet  Commonly known as:  ORTHO-CYCLEN,SPRINTEC,PREVIFEM      TAKE these medications        diphenhydrAMINE 25 mg capsule  Commonly known as:  BENADRYL  Take 25 mg by mouth at bedtime as needed for allergies.     megestrol 40 MG tablet  Commonly known as:  MEGACE  Take 3 tablets daily for 3 days, 2 tablets daily for 3 days, then 1 tablet daily     multivitamin with minerals Tabs tablet  Take 1 tablet by mouth daily.  NEXPLANON 68 MG Impl implant  Generic drug:  etonogestrel  1 each by Subdermal route once.       Follow-up Information    Follow up with [facility].   Why:  Return to MAU as needed for emergencies   Contact information:   [address] [place] [address] [phone]       Sharen Counter Certified Nurse-Midwife 05/02/2016  3:26 AM

## 2016-05-02 NOTE — Discharge Instructions (Signed)

## 2016-05-03 LAB — CULTURE, OB URINE

## 2016-05-05 LAB — GC/CHLAMYDIA PROBE AMP (~~LOC~~) NOT AT ARMC
CHLAMYDIA, DNA PROBE: NEGATIVE
Neisseria Gonorrhea: NEGATIVE

## 2018-02-11 ENCOUNTER — Emergency Department (HOSPITAL_COMMUNITY): Payer: No Typology Code available for payment source

## 2018-02-11 ENCOUNTER — Encounter (HOSPITAL_COMMUNITY): Payer: Self-pay | Admitting: Emergency Medicine

## 2018-02-11 ENCOUNTER — Emergency Department (HOSPITAL_COMMUNITY)
Admission: EM | Admit: 2018-02-11 | Discharge: 2018-02-11 | Disposition: A | Discharge status: Home / Self Care | Payer: No Typology Code available for payment source | Attending: Emergency Medicine | Admitting: Emergency Medicine

## 2018-02-11 DIAGNOSIS — Y92410 Unspecified street and highway as the place of occurrence of the external cause: Secondary | ICD-10-CM | POA: Insufficient documentation

## 2018-02-11 DIAGNOSIS — Y999 Unspecified external cause status: Secondary | ICD-10-CM | POA: Diagnosis not present

## 2018-02-11 DIAGNOSIS — S63502A Unspecified sprain of left wrist, initial encounter: Secondary | ICD-10-CM | POA: Diagnosis not present

## 2018-02-11 DIAGNOSIS — S0990XA Unspecified injury of head, initial encounter: Secondary | ICD-10-CM | POA: Insufficient documentation

## 2018-02-11 DIAGNOSIS — Y9389 Activity, other specified: Secondary | ICD-10-CM | POA: Insufficient documentation

## 2018-02-11 DIAGNOSIS — R0789 Other chest pain: Secondary | ICD-10-CM | POA: Diagnosis not present

## 2018-02-11 MED ORDER — METHOCARBAMOL 500 MG PO TABS: 500.0000 mg | ORAL_TABLET | Freq: Two times a day (BID) | ORAL | 0 refills | Status: DC

## 2018-02-11 NOTE — Discharge Instructions (Signed)
Ice and elevate your wrist. Keep splinted for 24/7 for at least a week. Take tylenol for pain. Robaxin for muscle spasms as needed. Follow up with your doctor in 3-5 days as needed.
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
## 2018-07-21 ENCOUNTER — Encounter (HOSPITAL_COMMUNITY): Payer: Self-pay | Admitting: Emergency Medicine

## 2018-07-21 ENCOUNTER — Ambulatory Visit (HOSPITAL_COMMUNITY)
Admission: EM | Admit: 2018-07-21 | Discharge: 2018-07-21 | Disposition: A | Discharge status: Home / Self Care | Payer: Self-pay | Attending: Family Medicine | Admitting: Family Medicine

## 2018-07-21 DIAGNOSIS — T148XXA Other injury of unspecified body region, initial encounter: Secondary | ICD-10-CM

## 2018-07-21 DIAGNOSIS — S29012A Strain of muscle and tendon of back wall of thorax, initial encounter: Secondary | ICD-10-CM

## 2018-07-21 DIAGNOSIS — S20221A Contusion of right back wall of thorax, initial encounter: Secondary | ICD-10-CM

## 2018-07-21 MED ORDER — CYCLOBENZAPRINE HCL 10 MG PO TABS: 5.0000 mg | ORAL_TABLET | Freq: Every day | ORAL | 0 refills | Status: DC
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
## 2018-07-21 NOTE — Discharge Instructions (Addendum)
It was nice meeting you!!  We will give you a small dose muscle relaxant. Be aware this may make you drowsy.  Gentle stretching and ice/heat could help.  Follow up as needed.

## 2019-11-19 ENCOUNTER — Other Ambulatory Visit: Payer: Self-pay

## 2019-11-19 ENCOUNTER — Encounter (HOSPITAL_COMMUNITY): Payer: Self-pay

## 2019-11-19 ENCOUNTER — Emergency Department (HOSPITAL_COMMUNITY)
Admission: EM | Admit: 2019-11-19 | Discharge: 2019-11-19 | Disposition: A | Discharge status: Home / Self Care | Payer: Medicaid Other | Attending: Emergency Medicine | Admitting: Emergency Medicine

## 2019-11-19 DIAGNOSIS — G5603 Carpal tunnel syndrome, bilateral upper limbs: Secondary | ICD-10-CM | POA: Insufficient documentation

## 2019-11-19 DIAGNOSIS — Z79899 Other long term (current) drug therapy: Secondary | ICD-10-CM | POA: Insufficient documentation

## 2019-11-19 MED ORDER — PREDNISONE 10 MG PO TABS: ORAL_TABLET | ORAL | 0 refills | Status: DC

## 2019-11-19 NOTE — ED Triage Notes (Signed)
Pt c/o bilateral hand pain radiating to both wrists with slight swelling since 11/04/2019. 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 Pt started working on 10/14/2019. Denies any injury.

## 2019-11-19 NOTE — ED Provider Notes (Signed)
[place] DEPT Provider Note   CSN: [id number] Arrival date & time: 11/19/19  8676     History Chief Complaint  Patient presents with  . Hand Pain    Bilateral    Patricia Ford is a 43 y.o. female.  Patient to ED with bilateral burning type hand pain that has been progressive for the past 2 weeks. 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 Tonight she lifted a box and threw it, causing increased pain. She feels her hands have been swelling. No weakness. She states she has had similar symptoms in the past with a diagnosis of carpal tunnel that was treated with injected steroids.  The history is provided by the patient. No language interpreter was used.       Past Medical History:  Diagnosis Date  . Medical history non-contributory     There are no problems to display for this patient.   Past Surgical History:  Procedure Laterality Date  . BREAST LUMPECTOMY       OB History    Gravida  3   Para  3   Term  3   Preterm      AB      Living  3     SAB      TAB      Ectopic      Multiple      Live Births              Family History  Problem Relation Age of Onset  . Alcohol abuse Neg Hx     Social History   Tobacco Use  . Smoking status: Never Smoker  . Smokeless tobacco: Never Used  Substance Use Topics  . Alcohol use: No  . Drug use: No    Home Medications Prior to Admission medications   Medication Sig Start Date End Date Taking? Authorizing Provider  cyclobenzaprine (FLEXERIL) 10 MG tablet Take 0.5 tablets (5 mg total) by mouth at bedtime. 07/21/18   Loura Halt A, NP  diphenhydrAMINE (BENADRYL) 25 mg capsule Take 25 mg by mouth at bedtime as needed for allergies.    [provider]  etonogestrel (NEXPLANON) 68 MG IMPL implant 1 each by Subdermal route once.    [provider]  megestrol (MEGACE) 40 MG tablet Take 3 tablets daily for 3 days, 2 tablets daily for 3 days, then 1  tablet daily Patient not taking: Reported on 07/21/2018 05/02/16   Leftwich-Kirby, Kathie Dike, CNM  methocarbamol (ROBAXIN) 500 MG tablet Take 1 tablet (500 mg total) by mouth 2 (two) times daily. Patient not taking: Reported on 07/21/2018 02/11/18   Jeannett Senior, PA-C  Multiple Vitamin (MULTIVITAMIN WITH MINERALS) TABS tablet Take 1 tablet by mouth daily.    [provider]    Allergies    Penicillins, Hydrocodone, Ibuprofen, Percocet [oxycodone-acetaminophen], Naproxen, and Vicodin [hydrocodone-acetaminophen]  Review of Systems   Review of Systems  Gastrointestinal: Negative.  Negative for nausea.  Musculoskeletal:       See HPI.  Skin: Negative.  Negative for color change.  Neurological: Negative.  Negative for numbness.    Physical Exam Updated Vital Signs BP (!) 146/92 (BP Location: Left Arm)   Pulse 78   Temp 98.2 F (36.8 C) (Oral)   Resp 16   Ht 5\' 5"  (1.651 m)   Wt 82.6 kg   SpO2 100%   BMI 30.29 kg/m   Physical Exam Constitutional:  Appearance: She is well-developed.  Pulmonary:     Effort: Pulmonary effort is normal.  Musculoskeletal:     Cervical back: Normal range of motion.     Comments: FROM hands bilaterally with symmetric strength. No deficits. No marked swelling or discoloration. Positive Tinel's.   Skin:    General: Skin is warm and dry.  Neurological:     Mental Status: She is alert and oriented to person, place, and time.     Sensory: No sensory deficit.     ED Results / Procedures / Treatments   Labs (all labs ordered are listed, but only abnormal results are displayed) Labs Reviewed - No data to display  EKG None  Radiology No results found.  Procedures Procedures (including critical care time)  Medications Ordered in ED Medications - No data to display  ED Course  I have reviewed the triage vital signs and the nursing notes.  Pertinent labs & imaging results that were available during my care of the patient were  reviewed by me and considered in my medical decision making (see chart for details).    MDM Rules/Calculators/A&P     CHA2DS2/VAS Stroke Risk Points      N/A >= 2 Points: High Risk  1 - 1.99 Points: Medium Risk  0 Points: Low Risk    A final score could not be computed because of missing components.: Last  Change: N/A     This score determines the patient's risk of having a stroke if the  patient has atrial fibrillation.      This score is not applicable to this patient. Components are not  calculated.                   Patient with a h/o carpal tunnel presents with bilateral hand pain x 2 weeks. Progressive  No significant swelling or discoloration. No evidence of infection or injury. Exam c/w recurrent carpal tunnel.   Braces provided for comfort. Will do taper dose steroid. Ortho f/u.  Final Clinical Impression(s) / ED Diagnoses Final diagnoses:  None   1. Bilateral carpal tunnel  Rx / DC Orders ED Discharge Orders    None       Elpidio Anis, PA-C 11/19/19 0452    Melene Plan, DO 11/19/19 2683

## 2019-11-19 NOTE — Discharge Instructions (Signed)
Wear braces for comfort and wrist support. Take prednisone as directed to reduce swelling and discomfort. Take Tylenol for pain as needed.

## 2020-03-02 ENCOUNTER — Encounter (HOSPITAL_COMMUNITY): Payer: Self-pay

## 2020-03-02 ENCOUNTER — Emergency Department (HOSPITAL_COMMUNITY): Payer: Self-pay

## 2020-03-02 ENCOUNTER — Other Ambulatory Visit: Payer: Self-pay

## 2020-03-02 ENCOUNTER — Emergency Department (HOSPITAL_COMMUNITY)
Admission: EM | Admit: 2020-03-02 | Discharge: 2020-03-02 | Disposition: A | Discharge status: Home / Self Care | Payer: Self-pay | Attending: Emergency Medicine | Admitting: Emergency Medicine

## 2020-03-02 DIAGNOSIS — Z79899 Other long term (current) drug therapy: Secondary | ICD-10-CM | POA: Insufficient documentation

## 2020-03-02 DIAGNOSIS — M25531 Pain in right wrist: Secondary | ICD-10-CM | POA: Insufficient documentation

## 2020-03-02 DIAGNOSIS — M25532 Pain in left wrist: Secondary | ICD-10-CM | POA: Insufficient documentation

## 2020-03-02 LAB — POC URINE PREG, ED: Preg Test, Ur: NEGATIVE

## 2020-03-02 MED ORDER — PREDNISONE 20 MG PO TABS: 40.0000 mg | ORAL_TABLET | Freq: Every day | ORAL | 0 refills | Status: AC

## 2020-03-02 NOTE — Discharge Instructions (Signed)
You can take Tylenol or Ibuprofen as directed for pain. You can alternate Tylenol and Ibuprofen every 4 hours. If you take Tylenol at 1pm, then you can take Ibuprofen at 5pm. Then you can take Tylenol again at 9pm.   Prednisone as directed.  Follow the RICE (Rest, Ice, Compression, Elevation) protocol as directed.   Wear splint for support and stabilization.  Follow-up with referred orthopedic doctor.  Return emergency department for any worsening pain, redness or swelling, fevers, weakness or any other worsening concerning symptoms.

## 2020-03-02 NOTE — ED Provider Notes (Signed)
[facility] DEPT Provider Note   CSN: [id number] Arrival date & time: 03/02/20  2134     History Chief Complaint  Patient presents with  . Wrist Pain    Patricia Ford is a 44 y.o. female presents for evaluation of bilateral wrist pain that has been ongoing since December 2020.  She states initially when symptoms began, she was seen here in the ED.  She was given a dose of prednisone which helped initially.  She reports that she works at the [employer] and states that she does a lot of repetitive motion.  She does not think she had any trauma.  She states that she feels like both of her wrists are swollen.  She reports that every once a while, she will get a shocking pain into her hands.  She feels like it hurts even to do daily motions like pulling up her close, using toilet paper.  She states she feels like she has numbness to the tips of her fingers.  She reports pain is worsened by movement.  She has not tried any other medications.  The history is provided by the patient.       Past Medical History:  Diagnosis Date  . Medical history non-contributory     There are no problems to display for this patient.   Past Surgical History:  Procedure Laterality Date  . BREAST LUMPECTOMY       OB History    Gravida  3   Para  3   Term  3   Preterm      AB      Living  3     SAB      TAB      Ectopic      Multiple      Live Births              Family History  Problem Relation Age of Onset  . Alcohol abuse Neg Hx     Social History   Tobacco Use  . Smoking status: Never Smoker  . Smokeless tobacco: Never Used  Substance Use Topics  . Alcohol use: No  . Drug use: No    Home Medications Prior to Admission medications   Medication Sig Start Date End Date Taking? Authorizing Provider  cyclobenzaprine (FLEXERIL) 10 MG tablet Take 0.5 tablets (5 mg total) by mouth at bedtime. 07/21/18   Loura Halt A, NP    diphenhydrAMINE (BENADRYL) 25 mg capsule Take 25 mg by mouth at bedtime as needed for allergies.    [provider]  etonogestrel (NEXPLANON) 68 MG IMPL implant 1 each by Subdermal route once.    [provider]  megestrol (MEGACE) 40 MG tablet Take 3 tablets daily for 3 days, 2 tablets daily for 3 days, then 1 tablet daily Patient not taking: Reported on 07/21/2018 05/02/16   Leftwich-Kirby, Kathie Dike, CNM  methocarbamol (ROBAXIN) 500 MG tablet Take 1 tablet (500 mg total) by mouth 2 (two) times daily. Patient not taking: Reported on 07/21/2018 02/11/18   Jeannett Senior, PA-C  Multiple Vitamin (MULTIVITAMIN WITH MINERALS) TABS tablet Take 1 tablet by mouth daily.    [provider]  predniSONE (DELTASONE) 20 MG tablet Take 2 tablets (40 mg total) by mouth daily for 4 days. 03/02/20 03/06/20  Volanda Napoleon, PA-C    Allergies    Penicillins, Hydrocodone, Ibuprofen, Percocet [oxycodone-acetaminophen], Naproxen, and Vicodin [hydrocodone-acetaminophen]  Review of Systems   Review of Systems  Musculoskeletal:       Wrist pain bilateral  Neurological: Negative for numbness.  All other systems reviewed and are negative.   Physical Exam Updated Vital Signs BP 122/78 (BP Location: Right Arm)   Pulse 83   Temp 98.4 F (36.9 C) (Oral)   Resp 16   Ht 5\' 5"  (1.651 m)   Wt 80.3 kg   SpO2 100%   BMI 29.45 kg/m   Physical Exam Vitals and nursing note reviewed.  Constitutional:      Appearance: She is well-developed.  HENT:     Head: Normocephalic and atraumatic.  Eyes:     General: No scleral icterus.       Right eye: No discharge.        Left eye: No discharge.     Conjunctiva/sclera: Conjunctivae normal.  Cardiovascular:     Pulses:          Radial pulses are 2+ on the right side and 2+ on the left side.  Pulmonary:     Effort: Pulmonary effort is normal.  Musculoskeletal:     Comments: Tenderness to palpation noted to bilateral wrists, most notably on  the volar aspect.  No deformity or crepitus noted.  Limited flexion/tension secondary to pain.  She can move all 5 digits of both hands without any difficulty.  Positive Phalen's, Tinel's.  No bony tenderness noted to the forearm.  No overlying warmth, erythema, edema.  Skin:    General: Skin is warm and dry.     Comments: Good distal cap refill.  BUE is not dusky in appearance or cool to touch.  Neurological:     Mental Status: She is alert.     Comments: Reports decrease in station of the tips of bilateral fingertips.  She reports she can feel me touching her but states it feels different.  This only is on the tips and does not extend further.  Psychiatric:        Speech: Speech normal.        Behavior: Behavior normal.     ED Results / Procedures / Treatments   Labs (all labs ordered are listed, but only abnormal results are displayed) Labs Reviewed  POC URINE PREG, ED    EKG None  Radiology DG Wrist Complete Left  Result Date: 03/02/2020 CLINICAL DATA:  Bilateral wrist pain. EXAM: LEFT WRIST - COMPLETE 3+ VIEW COMPARISON:  Left wrist radiograph 02/11/2018 FINDINGS: There is no evidence of fracture or dislocation. 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 Alignment and joint spaces are otherwise maintained. There is no evidence of arthropathy or other focal bone abnormality. Soft tissues are unremarkable. IMPRESSION: 1. No acute findings or explanation for wrist pain. 2. Osseous lunotriquetral coalition, typically incidental. Electronically Signed   By: 04/13/2018 M.D.   On: 03/02/2020 22:54   DG Wrist Complete Right  Result Date: 03/02/2020 CLINICAL DATA:  Bilateral wrist pain. EXAM: RIGHT WRIST - COMPLETE 3+ VIEW COMPARISON:  None. FINDINGS: There is no evidence of fracture or dislocation. Osseous lunotriquetral coalition. Alignment joint spaces are otherwise maintained. There is no evidence of arthropathy or other focal bone abnormality. Soft tissues are  unremarkable. IMPRESSION: 1. No acute findings or explanation for wrist pain. 2. Osseous lunotriquetral coalition, typically incidental. Electronically Signed   By: 03/04/2020 M.D.   On: 03/02/2020 22:55    Procedures Procedures (including critical care time)  Medications Ordered in ED Medications - No data to display  ED Course  I have reviewed  the triage vital signs and the nursing notes.  Pertinent labs & imaging results that were available during my care of the patient were reviewed by me and considered in my medical decision making (see chart for details).    MDM Rules/Calculators/A&P                      44 year old female who presents for evaluation of bilateral wrist pain that has been ongoing since December 2020.  Reports that symptoms have continued to persist.  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  She reports some tingling sensation in the tips of her fingers. Patient is afebrile, non-toxic appearing, sitting comfortably on examination table. Vital signs reviewed and stable.  Patient with good pulses, cap refill.  She does report some decrease sensation to the tips of her fingers.  She has positive Tinel's and Phalen's.  Consider carpal tunnel syndrome versus wrist sprains.  History/physical exam is not concerning for ischemic limbs, septic arthritis.  Additionally, do not suspect any fracture or dislocation but given that this has been a prolonged history, will obtain x-rays.  X-ray of wrist shows no acute findings for bilateral wrist.  Discussed results with patient.  We will give her a short course of prednisone to help with symptoms.  Additionally, patient provided outpatient referral to Ortho and bilateral wrist sprains to help with support and stabilization. At this time, patient exhibits no emergent life-threatening condition that require further evaluation in ED. Patient had ample opportunity for questions and discussion.  All patient's questions were answered with full understanding. Strict return precautions discussed. Patient expresses understanding and agreement to plan.   Portions of this note were generated with Scientist, clinical (histocompatibility and immunogenetics). Dictation errors may occur despite best attempts at proofreading.   Final Clinical Impression(s) / ED Diagnoses Final diagnoses:  Pain in both wrists    Rx / DC Orders ED Discharge Orders         Ordered    predniSONE (DELTASONE) 20 MG tablet  Daily     03/02/20 2305           Rosana Hoes 03/02/20 2311    Donnetta Hutching, MD 03/03/20 [phone]

## 2020-03-02 NOTE — ED Notes (Signed)
Patient taken to Xray  

## 2020-03-02 NOTE — ED Triage Notes (Signed)
Pt reports bilateral wrist pain. She works at the [employer] and does a lot of repetitive motion. She has been placed on prednisone before for same and it helped.

## 2020-03-02 NOTE — ED Notes (Signed)
Patient returned from X-ray 

## 2020-07-24 ENCOUNTER — Other Ambulatory Visit: Payer: Self-pay

## 2020-07-24 ENCOUNTER — Ambulatory Visit (HOSPITAL_COMMUNITY)
Admission: EM | Admit: 2020-07-24 | Discharge: 2020-07-24 | Disposition: A | Discharge status: Home / Self Care | Payer: Medicaid Other | Attending: Urgent Care | Admitting: Urgent Care

## 2020-07-24 ENCOUNTER — Encounter (HOSPITAL_COMMUNITY): Payer: Self-pay | Admitting: Emergency Medicine

## 2020-07-24 DIAGNOSIS — M25532 Pain in left wrist: Secondary | ICD-10-CM

## 2020-07-24 DIAGNOSIS — M778 Other enthesopathies, not elsewhere classified: Secondary | ICD-10-CM

## 2020-07-24 DIAGNOSIS — M25531 Pain in right wrist: Secondary | ICD-10-CM

## 2020-07-24 DIAGNOSIS — G5603 Carpal tunnel syndrome, bilateral upper limbs: Secondary | ICD-10-CM

## 2020-07-24 MED ORDER — PREDNISONE 20 MG PO TABS: ORAL_TABLET | ORAL | 0 refills | Status: DC

## 2020-07-24 NOTE — Discharge Instructions (Addendum)
Please just use Tylenol at a dose of 500mg-650mg once every 6 hours as needed for your aches, pains, fevers. Do not use any nonsteroidal anti-inflammatories (NSAIDs) like ibuprofen, Motrin, naproxen, Aleve, etc. which are all available over-the-counter.   

## 2020-07-24 NOTE — ED Triage Notes (Signed)
Pt presents with wrist pain. States has a hx of carpal tunnel and states the pain radiates from the hands to shoulder. States the pain is to the bone.   States was seen at [facility] and was given prednisone. States gave relief for swelling but did not help the pain.

## 2020-07-24 NOTE — ED Provider Notes (Signed)
[facility]   MRN: [id number] DOB: [birth year]  Subjective:   Patricia Ford is a 44 y.o. female presenting for acute on chronic bilateral wrist pain all around that shoots back of her forearms.  She has also had some intermittent tingling of her fingertips.  Last evaluation patient had was in March through [facility] emergency room.  She was prescribed an oral steroid course with good relief but pain has returned.  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  She uses her hands regularly at her jobs.  States that she knows this is the problem but has not found other work opportunities.  She has a history of allergic reactions to hydrocodone, ibuprofen, Tylenol.  She is unable to take these medicines.  Has not seen an orthopedist.  She just got her health insurance and plans on establishing care with a new PCP and would like a referral to an orthopedist.  No current facility-administered medications for this encounter.  Current Outpatient Medications:  .  cyclobenzaprine (FLEXERIL) 10 MG tablet, Take 0.5 tablets (5 mg total) by mouth at bedtime., Disp: 20 tablet, Rfl: 0 .  diphenhydrAMINE (BENADRYL) 25 mg capsule, Take 25 mg by mouth at bedtime as needed for allergies., Disp: , Rfl:  .  etonogestrel (NEXPLANON) 68 MG IMPL implant, 1 each by Subdermal route once., Disp: , Rfl:  .  megestrol (MEGACE) 40 MG tablet, Take 3 tablets daily for 3 days, 2 tablets daily for 3 days, then 1 tablet daily (Patient not taking: Reported on 07/21/2018), Disp: 40 tablet, Rfl: 2 .  methocarbamol (ROBAXIN) 500 MG tablet, Take 1 tablet (500 mg total) by mouth 2 (two) times daily. (Patient not taking: Reported on 07/21/2018), Disp: 20 tablet, Rfl: 0 .  Multiple Vitamin (MULTIVITAMIN WITH MINERALS) TABS tablet, Take 1 tablet by mouth daily., Disp: , Rfl:    Allergies  Allergen Reactions  . Penicillins Palpitations and Other (See Comments)    Has patient had a PCN  reaction causing immediate rash, facial/tongue/throat swelling, SOB or lightheadedness with hypotension: No Has patient had a PCN reaction causing severe rash involving mucus membranes or skin necrosis: No Has patient had a PCN reaction that required hospitalization No Has patient had a PCN reaction occurring within the last 10 years: No If all of the above answers are "NO", then may proceed with Cephalosporin use.  Marland Kitchen Hydrocodone Hives  . Ibuprofen Other (See Comments)    Pink-eye  . Percocet [Oxycodone-Acetaminophen] Hives  . Naproxen Swelling and Rash  . Vicodin [Hydrocodone-Acetaminophen] Swelling and Rash    Past Medical History:  Diagnosis Date  . Medical history non-contributory      Past Surgical History:  Procedure Laterality Date  . BREAST LUMPECTOMY      Family History  Problem Relation Age of Onset  . Hypertension Mother   . Hypertension Father   . Alcohol abuse Neg Hx     Social History   Tobacco Use  . Smoking status: Never Smoker  . Smokeless tobacco: Never Used  Vaping Use  . Vaping Use: Never used  Substance Use Topics  . Alcohol use: No  . Drug use: No    ROS   Objective:   Vitals: BP 118/63 (BP Location: Right Arm)   Pulse 78   Temp 98.5 F (36.9 C) (Oral)   Resp 18   SpO2 98%   Physical Exam Constitutional:      General:  She is not in acute distress.    Appearance: Normal appearance. She is well-developed. She is not ill-appearing, toxic-appearing or diaphoretic.  HENT:     Head: Normocephalic and atraumatic.     Right Ear: External ear normal.     Left Ear: External ear normal.     Nose: Nose normal.     Mouth/Throat:     Mouth: Mucous membranes are moist.     Pharynx: Oropharynx is clear.  Eyes:     General: No scleral icterus.       Right eye: No discharge.        Left eye: No discharge.     Extraocular Movements: Extraocular movements intact.     Conjunctiva/sclera: Conjunctivae normal.     Pupils: Pupils are equal,  round, and reactive to light.  Cardiovascular:     Rate and Rhythm: Normal rate.  Pulmonary:     Effort: Pulmonary effort is normal.  Musculoskeletal:     Right wrist: Swelling, tenderness (throughout, worse over dorsal aspect) and bony tenderness present. No deformity, effusion, lacerations, snuff box tenderness or crepitus. Decreased range of motion.     Left wrist: Swelling (trace), tenderness (throughout; positive Tinel, negative Phalen test for CTS) and bony tenderness present. No deformity, effusion, lacerations, snuff box tenderness or crepitus. Decreased range of motion.     Right hand: No swelling, deformity, lacerations, tenderness or bony tenderness. Normal range of motion. Normal strength. Normal sensation.     Left hand: No swelling, deformity, lacerations, tenderness or bony tenderness. Normal range of motion. Normal strength. Normal sensation.  Skin:    General: Skin is warm and dry.  Neurological:     General: No focal deficit present.     Mental Status: She is alert and oriented to person, place, and time.  Psychiatric:        Mood and Affect: Mood normal.        Behavior: Behavior normal.        Thought Content: Thought content normal.        Judgment: Judgment normal.     Assessment and Plan :   I have reviewed the PDMP during this encounter.  1. Bilateral wrist pain   2. Wrist tendonitis   3. Bilateral carpal tunnel syndrome     Patient has significant allergies to medications that we would otherwise use for bilateral extensor wrist tendinitis and CTS.  Emphasized need to consider different employment opportunities.  Will otherwise use prednisone.  Schedule Tylenol thereafter.  Follow-up with Ortho, information provided to the patient. Counseled patient on potential for adverse effects with medications prescribed/recommended today, ER and return-to-clinic precautions discussed, patient verbalized understanding.    Wallis Bamberg, PA-C 07/24/20 1258

## 2020-09-18 ENCOUNTER — Encounter (HOSPITAL_COMMUNITY): Payer: Self-pay | Admitting: Emergency Medicine

## 2020-09-18 ENCOUNTER — Emergency Department (HOSPITAL_COMMUNITY): Payer: 59

## 2020-09-18 ENCOUNTER — Other Ambulatory Visit: Payer: Self-pay

## 2020-09-18 ENCOUNTER — Emergency Department (HOSPITAL_COMMUNITY)
Admission: EM | Admit: 2020-09-18 | Discharge: 2020-09-18 | Disposition: A | Discharge status: Home / Self Care | Payer: 59 | Attending: Emergency Medicine | Admitting: Emergency Medicine

## 2020-09-18 DIAGNOSIS — Y9269 Other specified industrial and construction area as the place of occurrence of the external cause: Secondary | ICD-10-CM | POA: Diagnosis not present

## 2020-09-18 DIAGNOSIS — W500XXA Accidental hit or strike by another person, initial encounter: Secondary | ICD-10-CM | POA: Insufficient documentation

## 2020-09-18 DIAGNOSIS — M79644 Pain in right finger(s): Secondary | ICD-10-CM | POA: Diagnosis not present

## 2020-09-18 DIAGNOSIS — M79641 Pain in right hand: Secondary | ICD-10-CM

## 2020-09-18 MED ORDER — PREDNISONE 20 MG PO TABS: 20.0000 mg | ORAL_TABLET | Freq: Every day | ORAL | 0 refills | Status: AC

## 2020-09-18 NOTE — ED Provider Notes (Signed)
[facility]-EMERGENCY DEPT Provider Note   CSN: [id number] Arrival date & time: 09/18/20  0351     History Chief Complaint  Patient presents with  . Hand Pain    Patricia Ford is a 44 y.o. female who presents for evaluation of right thumb pain that occurred 3 days ago.  She reports that she has tendinitis and carpal tunnel syndrome bilaterally and she was feeling some pain in her hands.  She states that she popped her knuckle over the right thumb 3 days ago and states that since then, it has been worsening pain.  She is concerned that she dislocated it.  She does report she can move the thumb but does report pain when doing so.  She states she has had some sharp shooting pains to her left hand and wrist as well which she states is typical of a flare for her.  She did not fall denies any other trauma injury.  She does have some tingling sensation that she has to the tips of her fingers which is not new. No weakness.   The history is provided by the patient.       Past Medical History:  Diagnosis Date  . Medical history non-contributory     There are no problems to display for this patient.   Past Surgical History:  Procedure Laterality Date  . BREAST LUMPECTOMY       OB History    Gravida  3   Para  3   Term  3   Preterm      AB      Living  3     SAB      TAB      Ectopic      Multiple      Live Births              Family History  Problem Relation Age of Onset  . Hypertension Mother   . Hypertension Father   . Alcohol abuse Neg Hx     Social History   Tobacco Use  . Smoking status: Never Smoker  . Smokeless tobacco: Never Used  Vaping Use  . Vaping Use: Never used  Substance Use Topics  . Alcohol use: Yes  . Drug use: No    Home Medications Prior to Admission medications   Medication Sig Start Date End Date Taking? Authorizing Provider  cyclobenzaprine (FLEXERIL) 10 MG tablet Take 0.5 tablets (5 mg total) by  mouth at bedtime. 07/21/18   Dahlia Byes A, NP  diphenhydrAMINE (BENADRYL) 25 mg capsule Take 25 mg by mouth at bedtime as needed for allergies.    [provider]  etonogestrel (NEXPLANON) 68 MG IMPL implant 1 each by Subdermal route once.    [provider]  megestrol (MEGACE) 40 MG tablet Take 3 tablets daily for 3 days, 2 tablets daily for 3 days, then 1 tablet daily Patient not taking: Reported on 07/21/2018 05/02/16   Leftwich-Kirby, Wilmer Floor, CNM  methocarbamol (ROBAXIN) 500 MG tablet Take 1 tablet (500 mg total) by mouth 2 (two) times daily. Patient not taking: Reported on 07/21/2018 02/11/18   Jaynie Crumble, PA-C  Multiple Vitamin (MULTIVITAMIN WITH MINERALS) TABS tablet Take 1 tablet by mouth daily.    [provider]  predniSONE (DELTASONE) 20 MG tablet Take 1 tablet (20 mg total) by mouth daily for 4 days. 09/18/20 09/22/20  Maxwell Caul, PA-C    Allergies    Penicillins, Hydrocodone, Ibuprofen, Percocet [  oxycodone-acetaminophen], Naproxen, and Vicodin [hydrocodone-acetaminophen]  Review of Systems   Review of Systems  Musculoskeletal:       Right thumb pain  Neurological: Positive for numbness (chronic).  All other systems reviewed and are negative.   Physical Exam Updated Vital Signs BP (!) 142/104 (BP Location: Left Arm)   Pulse 81   Temp 98.1 F (36.7 C) (Oral)   Resp 17   SpO2 98%   Physical Exam Vitals and nursing note reviewed.  Constitutional:      Appearance: She is well-developed.  HENT:     Head: Normocephalic and atraumatic.  Eyes:     General: No scleral icterus.       Right eye: No discharge.        Left eye: No discharge.     Conjunctiva/sclera: Conjunctivae normal.  Cardiovascular:     Pulses:          Radial pulses are 2+ on the right side and 2+ on the left side.  Pulmonary:     Effort: Pulmonary effort is normal.  Musculoskeletal:     Comments: Tenderness noted diffusely to the right thumb.  No deformity or  crepitus noted.  Flexion/tension as well as opposition intact by difficulty.  No deformity or crepitus noted.  Flexion/tension of all other digits intact by difficulty.  Skin:    General: Skin is warm and dry.     Comments: Good distal cap refill. RUE is not dusky in appearance or cool to touch.  Neurological:     Mental Status: She is alert.     Comments: Slight decrease sensation in the tips of the fingers which is been there before.  Psychiatric:        Speech: Speech normal.        Behavior: Behavior normal.     ED Results / Procedures / Treatments   Labs (all labs ordered are listed, but only abnormal results are displayed) Labs Reviewed - No data to display  EKG None  Radiology DG Hand Complete Right  Result Date: 09/18/2020 CLINICAL DATA:  Pain after cracking knuckles. EXAM: RIGHT HAND - COMPLETE 3+ VIEW COMPARISON:  None. FINDINGS: There is no evidence of fracture or dislocation. There is no evidence of arthropathy or other focal bone abnormality. Soft tissues are unremarkable. IMPRESSION: Negative right hand radiographs. Electronically Signed   By: Marin Roberts M.D.   On: 09/18/2020 04:45    Procedures Procedures (including critical care time)  Medications Ordered in ED Medications - No data to display  ED Course  I have reviewed the triage vital signs and the nursing notes.  Pertinent labs & imaging results that were available during my care of the patient were reviewed by me and considered in my medical decision making (see chart for details).    MDM Rules/Calculators/A&P                          44 year old female who presents for evaluation of right hand pain.  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  She reports that she does not currently follow with an orthopedic or hand doctor.  She reports about 3 days ago, her thumb was hurting her more so she cracked to the knuckle.  She is concerned she dislocated the finger  she has been having pain since then.  She has some numbness to the tips of her fingers which she states always occurs.  On initial  arrival, she is afebrile nontoxic-appearing.  Vital signs are stable.  She does have some decrease sensation in the tips of her fingers which states is normal for her.  She has tenderness noted diffusely to the right thumb.  No deformity or crepitus noted.  She is able to move it without any difficulty.  Doubt dislocation.  We will plan to check x-ray.  I suspect this is most likely a sprain.  X-ray reviewed.  Negative for any acute bony abnormality.  Discussed results with patient.  We will put her in a brace.  Will give short course of prednisone as she states that is what helps with her pain flareup.  Patient instructed follow-up with ortho. At this time, patient exhibits no emergent life-threatening condition that require further evaluation in ED. Patient had ample opportunity for questions and discussion. All patient's questions were answered with full understanding. Strict return precautions discussed. Patient expresses understanding and agreement to plan.   Portions of this note were generated with Scientist, clinical (histocompatibility and immunogenetics). Dictation errors may occur despite best attempts at proofreading.   Final Clinical Impression(s) / ED Diagnoses Final diagnoses:  Right hand pain    Rx / DC Orders ED Discharge Orders         Ordered    predniSONE (DELTASONE) 20 MG tablet  Daily        09/18/20 0458           Maxwell Caul, PA-C 09/18/20 0541    Gilda Crease, MD 09/18/20 [phone]

## 2020-09-18 NOTE — ED Triage Notes (Signed)
Patient complaining of hand tingling and pain from carpel tunnel and tendonitis in the left hand/wrist. Patient states she was at work and popped her knuckle and when she did states she thinks she dislocated it. Patient complaining of pain to the thumb and decreased movement.

## 2020-09-18 NOTE — Discharge Instructions (Addendum)
You can take Tylenol or Ibuprofen as directed for pain. You can alternate Tylenol and Ibuprofen every 4 hours. If you take Tylenol at 1pm, then you can take Ibuprofen at 5pm. Then you can take Tylenol again at 9pm.   Take prednisone as directed.  Follow up with the referred orthopedic doctor.   Return for any worsening pain, numbness/weakness, redness or any other worsening or concernig symptom.s

## 2020-11-26 DIAGNOSIS — Z3042 Encounter for surveillance of injectable contraceptive: Secondary | ICD-10-CM | POA: Diagnosis not present

## 2020-11-26 DIAGNOSIS — Z3009 Encounter for other general counseling and advice on contraception: Secondary | ICD-10-CM | POA: Diagnosis not present

## 2020-12-02 ENCOUNTER — Other Ambulatory Visit: Payer: Self-pay

## 2020-12-02 DIAGNOSIS — R1032 Left lower quadrant pain: Secondary | ICD-10-CM | POA: Diagnosis not present

## 2020-12-02 DIAGNOSIS — I10 Essential (primary) hypertension: Secondary | ICD-10-CM | POA: Diagnosis not present

## 2020-12-02 DIAGNOSIS — R1084 Generalized abdominal pain: Secondary | ICD-10-CM | POA: Diagnosis not present

## 2020-12-02 DIAGNOSIS — R109 Unspecified abdominal pain: Secondary | ICD-10-CM | POA: Diagnosis present

## 2020-12-02 DIAGNOSIS — N201 Calculus of ureter: Secondary | ICD-10-CM | POA: Insufficient documentation

## 2020-12-02 DIAGNOSIS — R001 Bradycardia, unspecified: Secondary | ICD-10-CM | POA: Diagnosis not present

## 2020-12-02 NOTE — ED Triage Notes (Signed)
Patient reports to the ER by EMS: Patient is reportedly having abdominal pain, nausea, and emesis. Patient is nude at this time. Patient reports that about 90 minutes ago she had a hot flash. Patient also reports that the pain radiates to the back.

## 2020-12-03 ENCOUNTER — Encounter (HOSPITAL_COMMUNITY): Payer: Self-pay | Admitting: Emergency Medicine

## 2020-12-03 ENCOUNTER — Emergency Department (HOSPITAL_COMMUNITY): Payer: 59

## 2020-12-03 ENCOUNTER — Emergency Department (HOSPITAL_COMMUNITY)
Admission: EM | Admit: 2020-12-03 | Discharge: 2020-12-03 | Disposition: A | Discharge status: Home / Self Care | Payer: 59 | Attending: Emergency Medicine | Admitting: Emergency Medicine

## 2020-12-03 DIAGNOSIS — N201 Calculus of ureter: Secondary | ICD-10-CM

## 2020-12-03 DIAGNOSIS — R109 Unspecified abdominal pain: Secondary | ICD-10-CM

## 2020-12-03 LAB — CBC
HCT: 40.8 % (ref 36.0–46.0)
Hemoglobin: 13.7 g/dL (ref 12.0–15.0)
MCH: 30.8 pg (ref 26.0–34.0)
MCHC: 33.6 g/dL (ref 30.0–36.0)
MCV: 91.7 fL (ref 80.0–100.0)
Platelets: 367 10*3/uL (ref 150–400)
RBC: 4.45 MIL/uL (ref 3.87–5.11)
RDW: 12.4 % (ref 11.5–15.5)
WBC: 14.6 10*3/uL — ABNORMAL HIGH (ref 4.0–10.5)
nRBC: 0 % (ref 0.0–0.2)

## 2020-12-03 LAB — COMPREHENSIVE METABOLIC PANEL
ALT: 18 U/L (ref 0–44)
AST: 24 U/L (ref 15–41)
Albumin: 4.4 g/dL (ref 3.5–5.0)
Alkaline Phosphatase: 58 U/L (ref 38–126)
Anion gap: 10 (ref 5–15)
BUN: 10 mg/dL (ref 6–20)
CO2: 24 mmol/L (ref 22–32)
Calcium: 9.3 mg/dL (ref 8.9–10.3)
Chloride: 105 mmol/L (ref 98–111)
Creatinine, Ser: 0.78 mg/dL (ref 0.44–1.00)
GFR, Estimated: >60 mL/min (ref >60)
Glucose, Bld: 121 mg/dL — ABNORMAL HIGH (ref 70–99)
Potassium: 3.8 mmol/L (ref 3.5–5.1)
Sodium: 139 mmol/L (ref 135–145)
Total Bilirubin: 0.7 mg/dL (ref 0.3–1.2)
Total Protein: 8.4 g/dL — ABNORMAL HIGH (ref 6.5–8.1)

## 2020-12-03 LAB — URINALYSIS, ROUTINE W REFLEX MICROSCOPIC
Bilirubin Urine: NEGATIVE
Glucose, UA: NEGATIVE mg/dL
Ketones, ur: NEGATIVE mg/dL
Leukocytes,Ua: NEGATIVE
Nitrite: NEGATIVE
Protein, ur: 30 mg/dL — AB
RBC / HPF: >50 RBC/hpf — ABNORMAL HIGH (ref 0–5)
Specific Gravity, Urine: 1.009 (ref 1.005–1.030)
pH: 6 (ref 5.0–8.0)

## 2020-12-03 LAB — LIPASE, BLOOD: Lipase: 25 U/L (ref 11–51)

## 2020-12-03 LAB — I-STAT BETA HCG BLOOD, ED (MC, WL, AP ONLY): I-stat hCG, quantitative: <5 m[IU]/mL (ref <5)

## 2020-12-03 MED ORDER — MORPHINE SULFATE (PF) 2 MG/ML IV SOLN
2.0000 mg | Freq: Once | INTRAVENOUS | Status: AC
  Administered 2020-12-03: 04:00:00 2 mg via INTRAVENOUS
  Filled 2020-12-03: qty 1

## 2020-12-03 MED ORDER — ONDANSETRON HCL 4 MG/2ML IJ SOLN
4.0000 mg | Freq: Once | INTRAMUSCULAR | Status: AC
  Administered 2020-12-03: 04:00:00 4 mg via INTRAVENOUS
  Filled 2020-12-03: qty 2

## 2020-12-03 MED ORDER — SODIUM CHLORIDE 0.9 % IV BOLUS
500.0000 mL | Freq: Once | INTRAVENOUS | Status: AC
  Administered 2020-12-03: 04:00:00 500 mL via INTRAVENOUS

## 2020-12-03 MED ORDER — TRAMADOL HCL 50 MG PO TABS: 50.0000 mg | ORAL_TABLET | Freq: Four times a day (QID) | ORAL | 0 refills | Status: DC | PRN

## 2020-12-03 MED ORDER — TAMSULOSIN HCL 0.4 MG PO CAPS: 0.4000 mg | ORAL_CAPSULE | Freq: Every day | ORAL | 0 refills | Status: AC

## 2020-12-03 NOTE — Discharge Instructions (Signed)
You have been seen in the Emergency Department (ED) today for pain that we believe based on your workup, is caused by kidney stones.  As we have discussed, please drink plenty of fluids.  Please make a follow up appointment with the physician(s) listed elsewhere in this documentation.  You may take pain medication as needed but ONLY as prescribed.  Please also take your prescribed Flomax daily.  We also recommend that you take over-the-counter ibuprofen regularly according to label instructions over the next 5 days.  Take it with meals to minimize stomach discomfort.  Please see your doctor as soon as possible as stones may take 1-3 weeks to pass and you may require additional care or medications.  Do not drink alcohol, drive or participate in any other potentially dangerous activities while taking opiate pain medication as it may make you sleepy. Do not take this medication with any other sedating medications, either prescription or over-the-counter. If you were prescribed Percocet or Vicodin, do not take these with acetaminophen (Tylenol) as it is already contained within these medications.   Take Tramadol as needed for severe pain.  This medication is an opiate (or narcotic) pain medication and can be habit forming.  Use it as little as possible to achieve adequate pain control.  Do not use or use it with extreme caution if you have a history of opiate abuse or dependence.  If you are on a pain contract with your primary care doctor or a pain specialist, be sure to let them know you were prescribed this medication today from the Emergency Department.  This medication is intended for your use only - do not give any to anyone else and keep it in a secure place where nobody else, especially children, have access to it.  It will also cause or worsen constipation, so you may want to consider taking an over-the-counter stool softener while you are taking this medication.  Return to the Emergency Department  (ED) or call your doctor if you have any worsening pain, fever, painful urination, are unable to urinate, or develop other symptoms that concern you.

## 2020-12-03 NOTE — ED Provider Notes (Signed)
Emergency Department Provider Note   I have reviewed the triage vital signs and the nursing notes.   HISTORY  Chief Complaint Abdominal Pain   HPI Patricia Ford is a 44 y.o. female with past medical history reviewed with the patient presents to the emergency department with acute onset left flank and abdominal pain with associated nausea and vomiting.  Patient states that she was getting ready for work this evening when pain began suddenly.  She felt very hot all over but denies chest pain or shortness of breath.  No associated diarrhea.  No chills or fever.  She tells me that the pain radiates from her left flank around to her lower abdomen.  No prior history of kidney stones.  She states since pain is begun she is noticed blood in the urine which is unusual for her.  She denies any vaginal bleeding or discharge.    Past Medical History:  Diagnosis Date  . Medical history non-contributory     There are no problems to display for this patient.  Allergies Penicillins, Hydrocodone, Ibuprofen, Percocet [oxycodone-acetaminophen], Naproxen, and Vicodin [hydrocodone-acetaminophen]  Family History  Problem Relation Age of Onset  . Hypertension Mother   . Hypertension Father   . Alcohol abuse Neg Hx     Social History Social History   Tobacco Use  . Smoking status: Never Smoker  . Smokeless tobacco: Never Used  Vaping Use  . Vaping Use: Never used  Substance Use Topics  . Alcohol use: Yes  . Drug use: No    Review of Systems  Constitutional: No fever/chills Eyes: No visual changes. ENT: No sore throat. Cardiovascular: Denies chest pain. Respiratory: Denies shortness of breath. Gastrointestinal: Positive left flank/abdominal pain.  Positive nausea and vomiting.  No diarrhea.  No constipation. Genitourinary: Negative for dysuria. Positive hematuria.  Musculoskeletal: Negative for back pain. Skin: Negative for rash. Neurological: Negative for headaches, focal  weakness or numbness.  10-point ROS otherwise negative.  ____________________________________________   PHYSICAL EXAM:  VITAL SIGNS: ED Triage Vitals  Enc Vitals Group     BP 12/02/20 2041 (!) 149/89     Pulse Rate 12/02/20 2041 98     Resp 12/02/20 2041 18     Temp 12/03/20 0308 98.5 F (36.9 C)     Temp Source 12/03/20 0308 Oral     SpO2 12/02/20 2041 98 %     Weight 12/03/20 0308 200 lb (90.7 kg)     Height 12/03/20 0308 5\' 5"  (1.651 m)   Constitutional: Alert and oriented. Well appearing and in no acute distress. Eyes: Conjunctivae are normal. Head: Atraumatic. Nose: No congestion/rhinnorhea. Mouth/Throat: Mucous membranes are moist.   Neck: No stridor. Cardiovascular: Normal rate, regular rhythm. Good peripheral circulation. Grossly normal heart sounds.   Respiratory: Normal respiratory effort.  No retractions. Lungs CTAB. Gastrointestinal: Soft and nontender. No CVA tenderness. No distention.  Musculoskeletal: No lower extremity tenderness nor edema. No gross deformities of extremities. Neurologic:  Normal speech and language.  Skin:  Skin is warm, dry and intact. No rash noted.  ____________________________________________   LABS (all labs ordered are listed, but only abnormal results are displayed)  Labs Reviewed  COMPREHENSIVE METABOLIC PANEL - Abnormal; Notable for the following components:      Result Value   Glucose, Bld 121 (*)    Total Protein 8.4 (*)    All other components within normal limits  CBC - Abnormal; Notable for the following components:   WBC 14.6 (*)  All other components within normal limits  URINALYSIS, ROUTINE W REFLEX MICROSCOPIC - Abnormal; Notable for the following components:   Hgb urine dipstick LARGE (*)    Protein, ur 30 (*)    RBC / HPF >50 (*)    Bacteria, UA FEW (*)    All other components within normal limits  LIPASE, BLOOD  I-STAT BETA HCG BLOOD, ED ([facility], [facility], [facility] ONLY)    ____________________________________________  RADIOLOGY  CT Renal Stone Study  Result Date: 12/03/2020 CLINICAL DATA:  Flank pain EXAM: CT ABDOMEN AND PELVIS WITHOUT CONTRAST TECHNIQUE: Multidetector CT imaging of the abdomen and pelvis was performed following the standard protocol without IV contrast. COMPARISON:  None. FINDINGS: Lower chest: The visualized heart size within normal limits. No pericardial fluid/thickening. No hiatal hernia. The visualized portions of the lungs are clear. Hepatobiliary: Although limited due to the lack of intravenous contrast, normal in appearance without gross focal abnormality. No evidence of calcified gallstones or biliary ductal dilatation. Pancreas:  Unremarkable.  No surrounding inflammatory changes. Spleen: Normal in size. Although limited due to the lack of intravenous contrast, normal in appearance. Adrenals/Urinary Tract: Both adrenal glands appear normal. Mild left-sided pelvicaliectasis and ureterectasis is seen down to the level of the distal ureter where there appears to be a 5 mm calculus present, series 2, image 70. No right-sided renal or collecting system calculi. The bladder is unremarkable. Stomach/Bowel: The stomach, small bowel, and colon are normal in appearance. No inflammatory changes or obstructive findings. appendix is normal. Vascular/Lymphatic: There are no enlarged abdominal or pelvic lymph nodes. No significant gross vascular findings are present given the lack of intravenous contrast. Reproductive: The uterus and adnexa are unremarkable. Other: No evidence of abdominal wall mass or hernia. Musculoskeletal: No acute or significant osseous findings. IMPRESSION: Mild left hydronephrosis to the distal ureter where there is a probable 5 mm calculus present. Electronically Signed   By: Jonna Clark M.D.   On: 12/03/2020 03:58    ____________________________________________   PROCEDURES  Procedure(s) performed:    Procedures  None ____________________________________________   INITIAL IMPRESSION / ASSESSMENT AND PLAN / ED COURSE  Pertinent labs & imaging results that were available during my care of the patient were reviewed by me and considered in my medical decision making (see chart for details).   Patient presents emergency department with flank and abdominal pain along with nausea vomiting beginning suddenly.  Noted to have hematuria as well.  Suspicion for ureterolithiasis is elevated.  Doubt vascular pathology.  Lower suspicion for diverticulitis in the setting.  Abdomen is diffusely soft with no significant/focal tenderness.  Plan for CT renal, labs, pain medications, reassess.  04:45 AM  CT imaging shows ureteral stone on the side of the patient's pain with hydronephrosis.  No evidence of infection on UA.  Patient has allergies to both Vicodin and Percocet with hives listed as the allergy in the merged chart.  Discussed this with the patient.  She has tolerated tramadol in the past well and so will prescribe that for breakthrough pain.  Patient has listed allergies to ibuprofen as well.  Plan for urology follow-up in the coming 1 to 2 weeks.  Discussed ED return precautions with the patient.  [place] drug database reviewed prior to tramadol prescription.  At this time, I do not feel there is any life-threatening condition present. I have reviewed and discussed all results (EKG, imaging, lab, urine as appropriate), exam findings with patient. I have reviewed nursing notes and appropriate previous records.  I feel the patient is safe to be discharged home without further emergent workup. Discussed usual and customary return precautions. Patient and family (if present) verbalize understanding and are comfortable with this plan.  Patient will follow-up with their primary care provider. If they do not have a primary care provider, information for follow-up has been provided to them. All questions  have been answered.  ____________________________________________  FINAL CLINICAL IMPRESSION(S) / ED DIAGNOSES  Final diagnoses:  Left flank pain  Ureterolithiasis     MEDICATIONS GIVEN DURING THIS VISIT:  Medications  ondansetron (ZOFRAN) injection 4 mg (4 mg Intravenous Given 12/03/20 0333)  morphine 2 MG/ML injection 2 mg (2 mg Intravenous Given 12/03/20 0333)  sodium chloride 0.9 % bolus 500 mL (0 mLs Intravenous Stopped 12/03/20 0506)     NEW OUTPATIENT MEDICATIONS STARTED DURING THIS VISIT:  Discharge Medication List as of 12/03/2020  4:56 AM    START taking these medications   Details  tamsulosin (FLOMAX) 0.4 MG CAPS capsule Take 1 capsule (0.4 mg total) by mouth daily for 14 days., Starting Tue 12/03/2020, Until Tue 12/17/2020, Normal    traMADol (ULTRAM) 50 MG tablet Take 1 tablet (50 mg total) by mouth every 6 (six) hours as needed., Starting Tue 12/03/2020, Normal        Note:  This document was prepared using Dragon voice recognition software and may include unintentional dictation errors.  Alona Bene, MD, [facility] Emergency [facility]    Neddie Steedman, Arlyss Repress, MD 12/03/20 2027

## 2020-12-09 ENCOUNTER — Other Ambulatory Visit: Payer: Self-pay | Admitting: Urology

## 2020-12-16 NOTE — Progress Notes (Signed)
COVID Vaccine Completed: Date COVID Vaccine completed: COVID vaccine manufacturer: Pf[employer]    [employer]   PCP - No PCP Cardiologist -   Chest x-ray -  EKG -  Stress Test -  ECHO -  Cardiac Cath -  Pacemaker/ICD device last checked:  Sleep Study -  CPAP -   Fasting Blood Sugar -  Checks Blood Sugar _____ times a day  Blood Thinner Instructions: Aspirin Instructions: Last Dose:  Anesthesia review:   Patient denies shortness of breath, fever, cough and chest pain at PAT appointment   Patient verbalized understanding of instructions that were given to them at the PAT appointment. Patient was also instructed that they will need to review over the PAT instructions again at home before surgery.

## 2020-12-18 ENCOUNTER — Encounter (HOSPITAL_COMMUNITY): Admission: RE | Payer: Self-pay | Source: Home / Self Care

## 2020-12-18 ENCOUNTER — Ambulatory Visit (HOSPITAL_COMMUNITY): Admission: RE | Admit: 2020-12-18 | Payer: 59 | Source: Home / Self Care | Admitting: Urology

## 2020-12-18 SURGERY — CYSTOSCOPY/URETEROSCOPY/HOLMIUM LASER/STENT PLACEMENT
Anesthesia: General | Laterality: Left

## 2020-12-25 ENCOUNTER — Other Ambulatory Visit: Payer: Self-pay

## 2020-12-25 ENCOUNTER — Encounter (HOSPITAL_COMMUNITY): Payer: Self-pay

## 2020-12-25 DIAGNOSIS — N201 Calculus of ureter: Secondary | ICD-10-CM | POA: Insufficient documentation

## 2020-12-25 DIAGNOSIS — Z743 Need for continuous supervision: Secondary | ICD-10-CM | POA: Diagnosis not present

## 2020-12-25 DIAGNOSIS — J80 Acute respiratory distress syndrome: Secondary | ICD-10-CM | POA: Diagnosis not present

## 2020-12-25 DIAGNOSIS — R109 Unspecified abdominal pain: Secondary | ICD-10-CM | POA: Diagnosis present

## 2020-12-25 DIAGNOSIS — N200 Calculus of kidney: Secondary | ICD-10-CM | POA: Diagnosis not present

## 2020-12-25 NOTE — ED Triage Notes (Signed)
Patient arrived stating she was diagnosed with a kidney stone and thought she passed it but now having left sided flank pain to the groin that started tonight.

## 2020-12-26 ENCOUNTER — Emergency Department (HOSPITAL_COMMUNITY)
Admission: EM | Admit: 2020-12-26 | Discharge: 2020-12-26 | Disposition: A | Discharge status: Home / Self Care | Payer: 59 | Attending: Emergency Medicine | Admitting: Emergency Medicine

## 2020-12-26 DIAGNOSIS — N201 Calculus of ureter: Secondary | ICD-10-CM

## 2020-12-26 LAB — URINALYSIS, ROUTINE W REFLEX MICROSCOPIC
Bilirubin Urine: NEGATIVE
Glucose, UA: NEGATIVE mg/dL
Ketones, ur: 20 mg/dL — AB
Leukocytes,Ua: NEGATIVE
Nitrite: NEGATIVE
Protein, ur: 30 mg/dL — AB
RBC / HPF: >50 RBC/hpf — ABNORMAL HIGH (ref 0–5)
Specific Gravity, Urine: 1.01 (ref 1.005–1.030)
pH: 6 (ref 5.0–8.0)

## 2020-12-26 LAB — I-STAT BETA HCG BLOOD, ED (MC, WL, AP ONLY): I-stat hCG, quantitative: <5 m[IU]/mL (ref <5)

## 2020-12-26 MED ORDER — SODIUM CHLORIDE 0.9 % IV BOLUS
1000.0000 mL | Freq: Once | INTRAVENOUS | Status: AC
  Administered 2020-12-26: 1000 mL via INTRAVENOUS

## 2020-12-26 MED ORDER — FENTANYL CITRATE (PF) 100 MCG/2ML IJ SOLN
100.0000 ug | Freq: Once | INTRAMUSCULAR | Status: AC
  Administered 2020-12-26: 100 ug via INTRAVENOUS
  Filled 2020-12-26: qty 2

## 2020-12-26 MED ORDER — ONDANSETRON HCL 4 MG/2ML IJ SOLN
4.0000 mg | Freq: Once | INTRAMUSCULAR | Status: AC
  Administered 2020-12-26: 4 mg via INTRAVENOUS
  Filled 2020-12-26: qty 2

## 2020-12-26 MED ORDER — TRAMADOL HCL 50 MG PO TABS: 50.0000 mg | ORAL_TABLET | Freq: Four times a day (QID) | ORAL | 0 refills | Status: AC | PRN

## 2020-12-26 MED ORDER — ONDANSETRON HCL 8 MG PO TABS: 8.0000 mg | ORAL_TABLET | ORAL | 1 refills | Status: AC | PRN

## 2020-12-26 NOTE — ED Notes (Signed)
Patient during triage laying herself in the floor with shirt lifted, staff attempt to have patient redress and sit in the chair but patient refuses.

## 2020-12-26 NOTE — ED Notes (Signed)
Pt called for recheck vitals, no response from lobby

## 2020-12-26 NOTE — ED Notes (Signed)
Pt ambulatory to bathroom, no assistance needed.  

## 2020-12-26 NOTE — ED Provider Notes (Addendum)
[facility]-EMERGENCY DEPT Provider Note: Lowella Dell, MD, FACEP  CSN: [id number] MRN: [id number] ARRIVAL: 12/25/20 at 2314 ROOM: WA09/WA09   CHIEF COMPLAINT  Flank Pain   HISTORY OF PRESENT ILLNESS  12/26/20 4:36 AM Patricia Ford is a 45 y.o. female who was seen in the ED on 12/03/2020 for left flank pain and was diagnosed with a 5 mm left distal ureteral calculus.  She thought she had passed it but had a return of pain on the left flank that began yesterday evening.  She rates her pain as a 10 out of 10 characterized as like previous ureteral colic.  Nothing makes it better or worse.  She has had associated hematuria (gross) as well as persistent nausea and vomiting for about the past 7 hours.   Past Medical History:  Diagnosis Date  . Medical history non-contributory     Past Surgical History:  Procedure Laterality Date  . BREAST LUMPECTOMY      Family History  Problem Relation Age of Onset  . Hypertension Mother   . Hypertension Father   . Alcohol abuse Neg Hx     Social History   Tobacco Use  . Smoking status: Never Smoker  . Smokeless tobacco: Never Used  Vaping Use  . Vaping Use: Never used  Substance Use Topics  . Alcohol use: Yes  . Drug use: No    Prior to Admission medications   Medication Sig Start Date End Date Taking? Authorizing Provider  ondansetron (ZOFRAN) 8 MG tablet Take 1 tablet (8 mg total) by mouth every 4 (four) hours as needed for nausea or vomiting. 12/26/20   Lautaro Koral, Jonny Ruiz, MD  traMADol (ULTRAM) 50 MG tablet Take 1 tablet (50 mg total) by mouth every 6 (six) hours as needed. 12/26/20   Breeann Reposa, MD    Allergies Penicillins, Hydrocodone, Ibuprofen, Percocet [oxycodone-acetaminophen], Naproxen, and Vicodin [hydrocodone-acetaminophen]   REVIEW OF SYSTEMS  Negative except as noted here or in the History of Present Illness.   PHYSICAL EXAMINATION  Initial Vital Signs Blood pressure (!) 152/110, pulse 71, temperature 97.7 F (36.5  C), temperature source Oral, resp. rate 16, SpO2 100 %.  Examination General: Well-developed, well-nourished female in no acute distress; appearance consistent with age of record HENT: normocephalic; atraumatic Eyes: Normal appearance Neck: supple Heart: regular rate and rhythm Lungs: clear to auscultation bilaterally Abdomen: soft; nondistended; nontender; bowel sounds present GU: Left CVA tenderness Extremities: No deformity; full range of motion Neurologic: Awake, alert and oriented; motor function intact in all extremities and symmetric; no facial droop Skin: Warm and dry Psychiatric: Normal mood and affect   RESULTS  Summary of this visit's results, reviewed and interpreted by myself:   EKG Interpretation  Date/Time:    Ventricular Rate:    PR Interval:    QRS Duration:   QT Interval:    QTC Calculation:   R Axis:     Text Interpretation:        Laboratory Studies: Results for orders placed or performed during the hospital encounter of 12/26/20 (from the past 24 hour(s))  Urinalysis, Routine w reflex microscopic Urine, Clean Catch     Status: Abnormal   Collection Time: 12/26/20  5:24 AM  Result Value Ref Range   Color, Urine YELLOW YELLOW   APPearance HAZY (A) CLEAR   Specific Gravity, Urine 1.010 1.005 - 1.030   pH 6.0 5.0 - 8.0   Glucose, UA NEGATIVE NEGATIVE mg/dL   Hgb urine dipstick LARGE (A) NEGATIVE  Bilirubin Urine NEGATIVE NEGATIVE   Ketones, ur 20 (A) NEGATIVE mg/dL   Protein, ur 30 (A) NEGATIVE mg/dL   Nitrite NEGATIVE NEGATIVE   Leukocytes,Ua NEGATIVE NEGATIVE   RBC / HPF >50 (H) 0 - 5 RBC/hpf   WBC, UA 6-10 0 - 5 WBC/hpf   Bacteria, UA RARE (A) NONE SEEN   Squamous Epithelial / LPF 0-5 0 - 5   Mucus PRESENT   I-Stat Beta hCG blood, ED ([facility], [facility], [facility] only)     Status: None   Collection Time: 12/26/20  5:57 AM  Result Value Ref Range   I-stat hCG, quantitative <5.0 <5 mIU/mL   Comment 3           Imaging Studies: No results found.  ED  COURSE and MDM  Nursing notes, initial and subsequent vitals signs, including pulse oximetry, reviewed and interpreted by myself.  Vitals:   12/25/20 2319 12/26/20 0431 12/26/20 0530  BP: 105/77 (!) 152/110 (!) 132/91  Pulse: 65 71 82  Resp: 19 16   Temp: 97.8 F (36.6 C) 97.7 F (36.5 C)   TempSrc: Oral Oral   SpO2: 100% 100% 99%   Medications  ondansetron (ZOFRAN) injection 4 mg (4 mg Intravenous Given 12/26/20 0543)  sodium chloride 0.9 % bolus 1,000 mL (0 mLs Intravenous Stopped 12/26/20 0635)  fentaNYL (SUBLIMAZE) injection 100 mcg (100 mcg Intravenous Given 12/26/20 0544)   6:10 AM Patient feeling better after IV fentanyl.  She states she can tolerate tramadol although she does not tolerate other narcotics.  Presentation is consistent with ureteral colic.  I suspect this is the same stone as no others were seen on her previous CT.  I do not believe a repeat CT is indicated at this time.  There is a significant radiation exposure and I would not expect a significant change to justify the CT.   PROCEDURES  Procedures   ED DIAGNOSES     ICD-10-CM   1. Ureterolithiasis  N20.1        Josealfredo Adkins, MD 12/26/20 5170    Paula Libra, MD 12/26/20 [phone]

## 2020-12-26 NOTE — ED Notes (Signed)
Discharge paperwork reviewed with pt.  Pt provided with urine strainer for home use.  Pt with no further questions, ambulatory to ED exit.

## 2021-01-19 ENCOUNTER — Other Ambulatory Visit: Payer: Self-pay

## 2021-01-19 ENCOUNTER — Emergency Department (HOSPITAL_COMMUNITY): Payer: 59

## 2021-01-19 ENCOUNTER — Emergency Department (HOSPITAL_COMMUNITY)
Admission: EM | Admit: 2021-01-19 | Discharge: 2021-01-19 | Disposition: A | Discharge status: Home / Self Care | Payer: 59 | Attending: Emergency Medicine | Admitting: Emergency Medicine

## 2021-01-19 ENCOUNTER — Encounter (HOSPITAL_COMMUNITY): Payer: Self-pay | Admitting: Emergency Medicine

## 2021-01-19 DIAGNOSIS — S2231XA Fracture of one rib, right side, initial encounter for closed fracture: Secondary | ICD-10-CM | POA: Insufficient documentation

## 2021-01-19 DIAGNOSIS — G501 Atypical facial pain: Secondary | ICD-10-CM | POA: Insufficient documentation

## 2021-01-19 DIAGNOSIS — T1490XA Injury, unspecified, initial encounter: Secondary | ICD-10-CM

## 2021-01-19 DIAGNOSIS — S299XXA Unspecified injury of thorax, initial encounter: Secondary | ICD-10-CM | POA: Diagnosis present

## 2021-01-19 DIAGNOSIS — R6884 Jaw pain: Secondary | ICD-10-CM | POA: Diagnosis not present

## 2021-01-19 DIAGNOSIS — S8002XA Contusion of left knee, initial encounter: Secondary | ICD-10-CM | POA: Diagnosis not present

## 2021-01-19 DIAGNOSIS — Y92410 Unspecified street and highway as the place of occurrence of the external cause: Secondary | ICD-10-CM | POA: Diagnosis not present

## 2021-01-19 LAB — CBC WITH DIFFERENTIAL/PLATELET
Abs Immature Granulocytes: 0.09 10*3/uL — ABNORMAL HIGH (ref 0.00–0.07)
Basophils Absolute: 0.1 10*3/uL (ref 0.0–0.1)
Basophils Relative: 1 %
Eosinophils Absolute: 0.2 10*3/uL (ref 0.0–0.5)
Eosinophils Relative: 2 %
HCT: 38.7 % (ref 36.0–46.0)
Hemoglobin: 13.3 g/dL (ref 12.0–15.0)
Immature Granulocytes: 1 %
Lymphocytes Relative: 30 %
Lymphs Abs: 3.2 10*3/uL (ref 0.7–4.0)
MCH: 30.2 pg (ref 26.0–34.0)
MCHC: 34.4 g/dL (ref 30.0–36.0)
MCV: 87.8 fL (ref 80.0–100.0)
Monocytes Absolute: 1 10*3/uL (ref 0.1–1.0)
Monocytes Relative: 9 %
Neutro Abs: 6.2 10*3/uL (ref 1.7–7.7)
Neutrophils Relative %: 57 %
Platelets: 314 10*3/uL (ref 150–400)
RBC: 4.41 MIL/uL (ref 3.87–5.11)
RDW: 12.4 % (ref 11.5–15.5)
WBC: 10.8 10*3/uL — ABNORMAL HIGH (ref 4.0–10.5)
nRBC: 0 % (ref 0.0–0.2)

## 2021-01-19 LAB — I-STAT BETA HCG BLOOD, ED (MC, WL, AP ONLY): I-stat hCG, quantitative: <5 m[IU]/mL (ref <5)

## 2021-01-19 LAB — COMPREHENSIVE METABOLIC PANEL
ALT: 15 U/L (ref 0–44)
AST: 18 U/L (ref 15–41)
Albumin: 3.8 g/dL (ref 3.5–5.0)
Alkaline Phosphatase: 47 U/L (ref 38–126)
Anion gap: 11 (ref 5–15)
BUN: 10 mg/dL (ref 6–20)
CO2: 22 mmol/L (ref 22–32)
Calcium: 9 mg/dL (ref 8.9–10.3)
Chloride: 104 mmol/L (ref 98–111)
Creatinine, Ser: 0.67 mg/dL (ref 0.44–1.00)
GFR, Estimated: >60 mL/min (ref >60)
Glucose, Bld: 110 mg/dL — ABNORMAL HIGH (ref 70–99)
Potassium: 3.4 mmol/L — ABNORMAL LOW (ref 3.5–5.1)
Sodium: 137 mmol/L (ref 135–145)
Total Bilirubin: 0.5 mg/dL (ref 0.3–1.2)
Total Protein: 7 g/dL (ref 6.5–8.1)

## 2021-01-19 MED ORDER — FENTANYL CITRATE (PF) 100 MCG/2ML IJ SOLN
50.0000 ug | Freq: Once | INTRAMUSCULAR | Status: AC
  Administered 2021-01-19: 50 ug via INTRAVENOUS
  Filled 2021-01-19: qty 2

## 2021-01-19 MED ORDER — IOHEXOL 300 MG/ML  SOLN
100.0000 mL | Freq: Once | INTRAMUSCULAR | Status: AC | PRN
  Administered 2021-01-19: 100 mL via INTRAVENOUS

## 2021-01-19 NOTE — Discharge Instructions (Signed)
Take ibuprofen 3 times a day with meals as needed for pain.  Do not take other anti-inflammatories at the same time (Advil, Motrin, naproxen, Aleve). You may supplement with Tylenol if you need further pain control. Use muscle creams or patches as needed for pain. Take tramadol as needed for severe breakthrough pain. Use ice packs or heating pads if this helps control your pain. You will likely have continued muscle stiffness and soreness over the next couple days.  Follow-up with primary care in 1 week if your symptoms are not improving. Return to the emergency room if you develop vision changes, vomiting, slurred speech, numbness, loss of bowel or bladder control, or any new or worsening symptoms.
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
## 2021-04-29 DIAGNOSIS — Z3042 Encounter for surveillance of injectable contraceptive: Secondary | ICD-10-CM | POA: Diagnosis not present

## 2021-04-29 DIAGNOSIS — Z113 Encounter for screening for infections with a predominantly sexual mode of transmission: Secondary | ICD-10-CM | POA: Diagnosis not present

## 2021-06-24 ENCOUNTER — Ambulatory Visit (INDEPENDENT_AMBULATORY_CARE_PROVIDER_SITE_OTHER): Payer: 59

## 2021-06-24 ENCOUNTER — Encounter (HOSPITAL_COMMUNITY): Payer: Self-pay

## 2021-06-24 ENCOUNTER — Ambulatory Visit (HOSPITAL_COMMUNITY)
Admission: EM | Admit: 2021-06-24 | Discharge: 2021-06-24 | Disposition: A | Discharge status: Home / Self Care | Payer: 59 | Attending: Emergency Medicine | Admitting: Emergency Medicine

## 2021-06-24 DIAGNOSIS — R079 Chest pain, unspecified: Secondary | ICD-10-CM

## 2021-06-24 DIAGNOSIS — R0781 Pleurodynia: Secondary | ICD-10-CM | POA: Diagnosis not present
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
## 2021-06-24 NOTE — Discharge Instructions (Addendum)
You still have a healing fracture to the right side cautious with lifting and moving  Take tylenol as needed

## 2021-06-24 NOTE — ED Notes (Signed)
Attempt to call patient from lobby.
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
## 2022-01-10 ENCOUNTER — Encounter (HOSPITAL_COMMUNITY): Payer: Self-pay

## 2022-01-10 ENCOUNTER — Other Ambulatory Visit: Payer: Self-pay

## 2022-01-10 ENCOUNTER — Emergency Department (HOSPITAL_COMMUNITY): Payer: 59

## 2022-01-10 ENCOUNTER — Emergency Department (HOSPITAL_COMMUNITY)
Admission: EM | Admit: 2022-01-10 | Discharge: 2022-01-10 | Disposition: A | Discharge status: Home / Self Care | Payer: 59 | Attending: Emergency Medicine | Admitting: Emergency Medicine

## 2022-01-10 DIAGNOSIS — Y990 Civilian activity done for income or pay: Secondary | ICD-10-CM | POA: Insufficient documentation

## 2022-01-10 DIAGNOSIS — S29011A Strain of muscle and tendon of front wall of thorax, initial encounter: Secondary | ICD-10-CM | POA: Diagnosis not present

## 2022-01-10 DIAGNOSIS — S299XXA Unspecified injury of thorax, initial encounter: Secondary | ICD-10-CM | POA: Diagnosis present

## 2022-01-10 DIAGNOSIS — Y92242 Post office as the place of occurrence of the external cause: Secondary | ICD-10-CM | POA: Diagnosis not present

## 2022-01-10 DIAGNOSIS — X500XXA Overexertion from strenuous movement or load, initial encounter: Secondary | ICD-10-CM | POA: Diagnosis not present

## 2022-01-10 DIAGNOSIS — R202 Paresthesia of skin: Secondary | ICD-10-CM | POA: Insufficient documentation
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
## 2022-01-10 NOTE — ED Notes (Signed)
Pt is in xray

## 2022-01-10 NOTE — Discharge Instructions (Addendum)
Return for any problem.  ?

## 2022-11-04 ENCOUNTER — Emergency Department (HOSPITAL_COMMUNITY)
Admission: EM | Admit: 2022-11-04 | Discharge: 2022-11-04 | Disposition: A | Discharge status: Home / Self Care | Payer: 59 | Attending: Emergency Medicine | Admitting: Emergency Medicine

## 2022-11-04 ENCOUNTER — Other Ambulatory Visit: Payer: Self-pay

## 2022-11-04 DIAGNOSIS — R030 Elevated blood-pressure reading, without diagnosis of hypertension: Secondary | ICD-10-CM

## 2022-11-04 DIAGNOSIS — I10 Essential (primary) hypertension: Secondary | ICD-10-CM | POA: Insufficient documentation

## 2022-11-04 LAB — BASIC METABOLIC PANEL
Anion gap: 10 (ref 5–15)
BUN: 8 mg/dL (ref 6–20)
CO2: 24 mmol/L (ref 22–32)
Calcium: 9.6 mg/dL (ref 8.9–10.3)
Chloride: 106 mmol/L (ref 98–111)
Creatinine, Ser: 0.83 mg/dL (ref 0.44–1.00)
GFR, Estimated: >60 mL/min (ref >60)
Glucose, Bld: 118 mg/dL — ABNORMAL HIGH (ref 70–99)
Potassium: 4.1 mmol/L (ref 3.5–5.1)
Sodium: 140 mmol/L (ref 135–145)

## 2022-11-04 LAB — CBC WITH DIFFERENTIAL/PLATELET
Abs Immature Granulocytes: 0.05 10*3/uL (ref 0.00–0.07)
Basophils Absolute: 0.1 10*3/uL (ref 0.0–0.1)
Basophils Relative: 1 %
Eosinophils Absolute: 0.2 10*3/uL (ref 0.0–0.5)
Eosinophils Relative: 2 %
HCT: 43.8 % (ref 36.0–46.0)
Hemoglobin: 14.5 g/dL (ref 12.0–15.0)
Immature Granulocytes: 1 %
Lymphocytes Relative: 37 %
Lymphs Abs: 3.6 10*3/uL (ref 0.7–4.0)
MCH: 30.3 pg (ref 26.0–34.0)
MCHC: 33.1 g/dL (ref 30.0–36.0)
MCV: 91.6 fL (ref 80.0–100.0)
Monocytes Absolute: 0.7 10*3/uL (ref 0.1–1.0)
Monocytes Relative: 7 %
Neutro Abs: 5.1 10*3/uL (ref 1.7–7.7)
Neutrophils Relative %: 52 %
Platelets: 329 10*3/uL (ref 150–400)
RBC: 4.78 MIL/uL (ref 3.87–5.11)
RDW: 12.5 % (ref 11.5–15.5)
WBC: 9.7 10*3/uL (ref 4.0–10.5)
nRBC: 0 % (ref 0.0–0.2)

## 2022-11-04 LAB — URINALYSIS, ROUTINE W REFLEX MICROSCOPIC
Bilirubin Urine: NEGATIVE
Glucose, UA: NEGATIVE mg/dL
Hgb urine dipstick: NEGATIVE
Ketones, ur: NEGATIVE mg/dL
Leukocytes,Ua: NEGATIVE
Nitrite: NEGATIVE
Protein, ur: NEGATIVE mg/dL
Specific Gravity, Urine: 1.014 (ref 1.005–1.030)
pH: 6 (ref 5.0–8.0)

## 2022-11-04 NOTE — Discharge Instructions (Signed)
Try to relax and stay low stress over the next few days. Follow-up with your primary care doctor. Return here for new concerns.

## 2022-11-04 NOTE — ED Triage Notes (Signed)
Pt arrives with c/o HTN. Per pt, BP was 180/110 and was sent by PCP for evaluation. Pt does not take BP meds.

## 2022-11-04 NOTE — ED Provider Triage Note (Signed)
Emergency Medicine Provider Triage Evaluation Note  Patricia Ford , a 46 y.o. female  was evaluated in triage.  Pt complains of HTN.  Seen by PCP today and sent in for BP readings of 180/110.  No hx of HTN, not on medications.  Denies chest pain, SOB, headaches, dizziness.  Review of Systems  Positive: High blood pressure Negative: Chest pain, SOB  Physical Exam  BP (!) 143/94 (BP Location: Right Arm)   Pulse 88   Temp 98.9 F (37.2 C)   Resp 16   SpO2 100%  Gen:   Awake, no distress   Resp:  Normal effort  MSK:   Moves extremities without difficulty  Other:    Medical Decision Making  Medically screening exam initiated at 12:35 AM.  Appropriate orders placed.  Patricia Ford was informed that the remainder of the evaluation will be completed by another provider, this initial triage assessment does not replace that evaluation, and the importance of remaining in the ED until their evaluation is complete.  BP in triage 143/94.  No focal deficits. Seems largely asymptomatic of this. EKG, labs ordered.   Patricia Hatchet, PA-C 11/04/22 0036
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
## 2022-11-06 DIAGNOSIS — Z419 Encounter for procedure for purposes other than remedying health state, unspecified: Secondary | ICD-10-CM | POA: Diagnosis not present

## 2022-12-07 DIAGNOSIS — Z419 Encounter for procedure for purposes other than remedying health state, unspecified: Secondary | ICD-10-CM | POA: Diagnosis not present

## 2023-01-07 DIAGNOSIS — Z419 Encounter for procedure for purposes other than remedying health state, unspecified: Secondary | ICD-10-CM | POA: Diagnosis not present

## 2023-02-05 DIAGNOSIS — Z419 Encounter for procedure for purposes other than remedying health state, unspecified: Secondary | ICD-10-CM | POA: Diagnosis not present

## 2023-03-08 DIAGNOSIS — Z419 Encounter for procedure for purposes other than remedying health state, unspecified: Secondary | ICD-10-CM | POA: Diagnosis not present

## 2023-03-29 ENCOUNTER — Encounter (HOSPITAL_COMMUNITY): Payer: Self-pay | Admitting: Emergency Medicine

## 2023-03-29 ENCOUNTER — Emergency Department (HOSPITAL_COMMUNITY)
Admission: EM | Admit: 2023-03-29 | Discharge: 2023-03-29 | Disposition: A | Discharge status: Home / Self Care | Payer: 59 | Attending: Emergency Medicine | Admitting: Emergency Medicine

## 2023-03-29 ENCOUNTER — Other Ambulatory Visit: Payer: Self-pay

## 2023-03-29 DIAGNOSIS — W272XXA Contact with scissors, initial encounter: Secondary | ICD-10-CM | POA: Insufficient documentation

## 2023-03-29 DIAGNOSIS — S61211A Laceration without foreign body of left index finger without damage to nail, initial encounter: Secondary | ICD-10-CM | POA: Insufficient documentation

## 2023-03-29 DIAGNOSIS — Z23 Encounter for immunization: Secondary | ICD-10-CM | POA: Insufficient documentation

## 2023-03-29 MED ORDER — TETANUS-DIPHTH-ACELL PERTUSSIS 5-2.5-18.5 LF-MCG/0.5 IM SUSY
0.5000 mL | PREFILLED_SYRINGE | Freq: Once | INTRAMUSCULAR | Status: AC
  Administered 2023-03-29: 0.5 mL via INTRAMUSCULAR
  Filled 2023-03-29: qty 0.5

## 2023-03-29 MED ORDER — CEPHALEXIN 500 MG PO CAPS: 500.0000 mg | ORAL_CAPSULE | Freq: Two times a day (BID) | ORAL | 0 refills | Status: AC

## 2023-03-29 MED ORDER — SODIUM CHLORIDE 0.9 % IV SOLN
1.0000 g | Freq: Once | INTRAVENOUS | Status: AC
  Administered 2023-03-29: 1 g via INTRAVENOUS
  Filled 2023-03-29: qty 10

## 2023-03-29 NOTE — ED Triage Notes (Signed)
Puncture wound from scissors 8pm yesterday. Tried ice but now swollen and painful. Unable to fulling bend pointer finger.  Unknown last tetanus.

## 2023-03-29 NOTE — ED Provider Notes (Signed)
[facility]-EMERGENCY DEPT [facility] Emergency Department Provider Note MRN:  [id number]  Arrival date & time: 03/29/23     Chief Complaint   Laceration   History of Present Illness   Patricia Ford is a 47 y.o. year-old female presents to the ED with chief complaint of left hand laceration.  She states that she accidentally cut her finger with her home scissors on Saturday evening (greater than 24 hours ago).  She reports some swelling and redness and pain that have developed around the laceration.  She reports some decreased movement.  Denies fevers or chills.  Denies any other associated symptoms.  History provided by patient.   Review of Systems  Pertinent positive and negative review of systems noted in HPI.    Physical Exam   Vitals:   03/29/23 0319  BP: (!) 139/93  Pulse: 86  Resp: 18  Temp: 98 F (36.7 C)  SpO2: 98%    CONSTITUTIONAL:  non toxic-appearing, NAD NEURO:  Alert and oriented x 3, CN 3-12 grossly intact EYES:  eyes equal and reactive ENT/NECK:  Supple, no stridor  CARDIO:  normal rate, regular rhythm, appears well-perfused  PULM:  No respiratory distress,  GI/GU:  non-distended,  MSK/SPINE: Slightly reduced range of motion SKIN:  no rash, there is mild erythema surrounding the laceration with some extension onto the second MCP of the left hand as pictured, no abscess      *Additional and/or pertinent findings included in MDM below  Diagnostic and Interventional Summary    EKG Interpretation  Date/Time:    Ventricular Rate:    PR Interval:    QRS Duration:   QT Interval:    QTC Calculation:   R Axis:     Text Interpretation:         Labs Reviewed - No data to display  No orders to display    Medications  Tdap (BOOSTRIX) injection 0.5 mL (has no administration in time range)  cefTRIAXone (ROCEPHIN) 1 g in sodium chloride 0.9 % 100 mL IVPB (1 g Intravenous New Bag/Given 03/29/23 0349)     Procedures  /  Critical  Care Procedures  ED Course and Medical Decision Making  I have reviewed the triage vital signs, the nursing notes, and pertinent available records from the EMR.  Social Determinants Affecting Complexity of Care: Patient has no clinically significant social determinants affecting this chief complaint..   ED Course:    Medical Decision Making Patient here with probable developing soft tissue infection surrounding laceration that occurred about 36 hours ago.  She has some mild erythema as pictured above.  No evidence of abscess.  Doubt deep space infection given shallow wound.   Given 1 dose of IV Rocephin.  Will discharge home on Keflex.  Tdap updated.  Will have patient follow-up if she has worsening.  Return precautions are discussed.  Risk Prescription drug management.     Consultants: No consultations were needed in caring for this patient.   Treatment and Plan: Emergency department workup does not suggest an emergent condition requiring admission or immediate intervention beyond  what has been performed at this time. The patient is safe for discharge and has  been instructed to return immediately for worsening symptoms, change in  symptoms or any other concerns    Final Clinical Impressions(s) / ED Diagnoses     ICD-10-CM   1. Laceration of left index finger without foreign body without damage to nail, initial encounter  W09.811B  ED Discharge Orders          Ordered    cephALEXin (KEFLEX) 500 MG capsule  2 times daily        03/29/23 0431              Discharge Instructions Discussed with and Provided to Patient:   Discharge Instructions   None      Roxy Horseman, PA-C 03/29/23 0436    Molpus, Jonny Ruiz, MD 03/29/23 [phone]

## 2023-03-31 ENCOUNTER — Telehealth: Payer: Self-pay

## 2023-03-31 NOTE — Telephone Encounter (Signed)
Spoke with patient to offer to set up an appointment-patient declined to schedule. AS, CMA

## 2023-04-07 DIAGNOSIS — Z419 Encounter for procedure for purposes other than remedying health state, unspecified: Secondary | ICD-10-CM | POA: Diagnosis not present

## 2023-05-08 DIAGNOSIS — Z419 Encounter for procedure for purposes other than remedying health state, unspecified: Secondary | ICD-10-CM | POA: Diagnosis not present

## 2023-06-07 DIAGNOSIS — Z419 Encounter for procedure for purposes other than remedying health state, unspecified: Secondary | ICD-10-CM | POA: Diagnosis not present

## 2023-07-08 DIAGNOSIS — Z419 Encounter for procedure for purposes other than remedying health state, unspecified: Secondary | ICD-10-CM | POA: Diagnosis not present

## 2023-08-08 DIAGNOSIS — Z419 Encounter for procedure for purposes other than remedying health state, unspecified: Secondary | ICD-10-CM | POA: Diagnosis not present

## 2023-09-07 DIAGNOSIS — Z419 Encounter for procedure for purposes other than remedying health state, unspecified: Secondary | ICD-10-CM | POA: Diagnosis not present

## 2023-11-07 DIAGNOSIS — Z419 Encounter for procedure for purposes other than remedying health state, unspecified: Secondary | ICD-10-CM | POA: Diagnosis not present

## 2023-12-08 DIAGNOSIS — Z419 Encounter for procedure for purposes other than remedying health state, unspecified: Secondary | ICD-10-CM | POA: Diagnosis not present

## 2024-01-08 DIAGNOSIS — Z419 Encounter for procedure for purposes other than remedying health state, unspecified: Secondary | ICD-10-CM | POA: Diagnosis not present

## 2024-02-05 DIAGNOSIS — Z419 Encounter for procedure for purposes other than remedying health state, unspecified: Secondary | ICD-10-CM | POA: Diagnosis not present

## 2024-03-18 DIAGNOSIS — Z419 Encounter for procedure for purposes other than remedying health state, unspecified: Secondary | ICD-10-CM | POA: Diagnosis not present

## 2024-04-17 DIAGNOSIS — Z419 Encounter for procedure for purposes other than remedying health state, unspecified: Secondary | ICD-10-CM | POA: Diagnosis not present

## 2024-05-18 DIAGNOSIS — Z419 Encounter for procedure for purposes other than remedying health state, unspecified: Secondary | ICD-10-CM | POA: Diagnosis not present

## 2024-06-17 DIAGNOSIS — Z419 Encounter for procedure for purposes other than remedying health state, unspecified: Secondary | ICD-10-CM | POA: Diagnosis not present

## 2024-07-18 DIAGNOSIS — Z419 Encounter for procedure for purposes other than remedying health state, unspecified: Secondary | ICD-10-CM | POA: Diagnosis not present

## 2024-08-18 DIAGNOSIS — Z419 Encounter for procedure for purposes other than remedying health state, unspecified: Secondary | ICD-10-CM | POA: Diagnosis not present

## 2024-10-18 DIAGNOSIS — Z419 Encounter for procedure for purposes other than remedying health state, unspecified: Secondary | ICD-10-CM | POA: Diagnosis not present
